# Patient Record
Sex: Female | Born: 1945 | Race: White | Hispanic: No | Smoking: Never smoker
Health system: Southern US, Community
[De-identification: ages and names within clinical notes are randomized; demographics above are authoritative.]

## PROBLEM LIST (undated history)

## (undated) DIAGNOSIS — K219 Gastro-esophageal reflux disease without esophagitis: Secondary | ICD-10-CM

## (undated) DIAGNOSIS — C801 Malignant (primary) neoplasm, unspecified: Secondary | ICD-10-CM

## (undated) DIAGNOSIS — F419 Anxiety disorder, unspecified: Secondary | ICD-10-CM

## (undated) DIAGNOSIS — D649 Anemia, unspecified: Secondary | ICD-10-CM

## (undated) DIAGNOSIS — R112 Nausea with vomiting, unspecified: Secondary | ICD-10-CM

## (undated) DIAGNOSIS — M797 Fibromyalgia: Secondary | ICD-10-CM

## (undated) DIAGNOSIS — I10 Essential (primary) hypertension: Secondary | ICD-10-CM

## (undated) DIAGNOSIS — Z9889 Other specified postprocedural states: Secondary | ICD-10-CM

## (undated) DIAGNOSIS — M1990 Unspecified osteoarthritis, unspecified site: Secondary | ICD-10-CM

## (undated) HISTORY — PX: CERVICAL FUSION: SHX112

## (undated) HISTORY — PX: BACK SURGERY: SHX140

## (undated) HISTORY — PX: OTHER SURGICAL HISTORY: SHX169

## (undated) HISTORY — PX: ABDOMINAL HYSTERECTOMY: SHX81

---

## 2013-09-01 ENCOUNTER — Ambulatory Visit (HOSPITAL_COMMUNITY): Payer: Medicare Other

## 2013-09-01 ENCOUNTER — Other Ambulatory Visit: Payer: Self-pay | Admitting: Neurosurgery

## 2013-09-01 ENCOUNTER — Encounter (HOSPITAL_COMMUNITY): Payer: Self-pay | Admitting: *Deleted

## 2013-09-01 ENCOUNTER — Encounter (HOSPITAL_COMMUNITY)
Admission: AD | Disposition: A | Discharge status: Home / Self Care | Payer: Self-pay | Source: Ambulatory Visit | Attending: Neurosurgery

## 2013-09-01 ENCOUNTER — Observation Stay (HOSPITAL_COMMUNITY)
Admission: AD | Admit: 2013-09-01 | Discharge: 2013-09-02 | Disposition: A | Discharge status: Home / Self Care | Payer: Medicare Other | Source: Ambulatory Visit | Attending: Neurosurgery | Admitting: Neurosurgery

## 2013-09-01 ENCOUNTER — Encounter (HOSPITAL_COMMUNITY): Payer: Self-pay | Admitting: Pharmacy Technician

## 2013-09-01 ENCOUNTER — Ambulatory Visit (HOSPITAL_COMMUNITY): Payer: Medicare Other | Admitting: Anesthesiology

## 2013-09-01 ENCOUNTER — Encounter (HOSPITAL_COMMUNITY): Payer: Medicare Other | Admitting: Anesthesiology

## 2013-09-01 DIAGNOSIS — IMO0002 Reserved for concepts with insufficient information to code with codable children: Secondary | ICD-10-CM | POA: Insufficient documentation

## 2013-09-01 DIAGNOSIS — M51379 Other intervertebral disc degeneration, lumbosacral region without mention of lumbar back pain or lower extremity pain: Secondary | ICD-10-CM | POA: Insufficient documentation

## 2013-09-01 DIAGNOSIS — M5126 Other intervertebral disc displacement, lumbar region: Principal | ICD-10-CM | POA: Insufficient documentation

## 2013-09-01 DIAGNOSIS — I10 Essential (primary) hypertension: Secondary | ICD-10-CM | POA: Insufficient documentation

## 2013-09-01 DIAGNOSIS — M5137 Other intervertebral disc degeneration, lumbosacral region: Secondary | ICD-10-CM | POA: Insufficient documentation

## 2013-09-01 DIAGNOSIS — IMO0001 Reserved for inherently not codable concepts without codable children: Secondary | ICD-10-CM | POA: Insufficient documentation

## 2013-09-01 DIAGNOSIS — F411 Generalized anxiety disorder: Secondary | ICD-10-CM | POA: Insufficient documentation

## 2013-09-01 DIAGNOSIS — K219 Gastro-esophageal reflux disease without esophagitis: Secondary | ICD-10-CM | POA: Insufficient documentation

## 2013-09-01 HISTORY — PX: LUMBAR LAMINECTOMY/DECOMPRESSION MICRODISCECTOMY: SHX5026

## 2013-09-01 HISTORY — DX: Anxiety disorder, unspecified: F41.9

## 2013-09-01 HISTORY — DX: Unspecified osteoarthritis, unspecified site: M19.90

## 2013-09-01 HISTORY — DX: Anemia, unspecified: D64.9

## 2013-09-01 HISTORY — DX: Gastro-esophageal reflux disease without esophagitis: K21.9

## 2013-09-01 HISTORY — DX: Fibromyalgia: M79.7

## 2013-09-01 HISTORY — DX: Malignant (primary) neoplasm, unspecified: C80.1

## 2013-09-01 HISTORY — DX: Essential (primary) hypertension: I10

## 2013-09-01 LAB — CBC
HCT: 33.8 % — ABNORMAL LOW (ref 36.0–46.0)
Hemoglobin: 12.3 g/dL (ref 12.0–15.0)
MCH: 30.4 pg (ref 26.0–34.0)
MCHC: 36.4 g/dL — ABNORMAL HIGH (ref 30.0–36.0)
MCV: 83.7 fL (ref 78.0–100.0)
PLATELETS: 410 10*3/uL — AB (ref 150–400)
RBC: 4.04 MIL/uL (ref 3.87–5.11)
RDW: 12 % (ref 11.5–15.5)
WBC: 12.8 10*3/uL — ABNORMAL HIGH (ref 4.0–10.5)

## 2013-09-01 LAB — BASIC METABOLIC PANEL
BUN: 15 mg/dL (ref 6–23)
CALCIUM: 9.6 mg/dL (ref 8.4–10.5)
CO2: 22 meq/L (ref 19–32)
CREATININE: 0.85 mg/dL (ref 0.50–1.10)
Chloride: 86 mEq/L — ABNORMAL LOW (ref 96–112)
GFR calc non Af Amer: 69 mL/min — ABNORMAL LOW (ref >90)
GFR, EST AFRICAN AMERICAN: 80 mL/min — AB (ref >90)
Glucose, Bld: 106 mg/dL — ABNORMAL HIGH (ref 70–99)
Potassium: 3.8 mEq/L (ref 3.7–5.3)
Sodium: 127 mEq/L — ABNORMAL LOW (ref 137–147)

## 2013-09-01 LAB — SURGICAL PCR SCREEN
MRSA, PCR: NEGATIVE
Staphylococcus aureus: NEGATIVE

## 2013-09-01 SURGERY — LUMBAR LAMINECTOMY/DECOMPRESSION MICRODISCECTOMY 1 LEVEL
Anesthesia: General | Site: Back | Laterality: Left

## 2013-09-01 MED ORDER — ESTRADIOL 1 MG PO TABS
1.0000 mg | ORAL_TABLET | Freq: Every day | ORAL | Status: DC
  Filled 2013-09-01: qty 1

## 2013-09-01 MED ORDER — NEOSTIGMINE METHYLSULFATE 10 MG/10ML IV SOLN
INTRAVENOUS | Status: DC | PRN
  Administered 2013-09-01 (×2): 3 mg via INTRAVENOUS

## 2013-09-01 MED ORDER — METHOCARBAMOL 500 MG PO TABS
500.0000 mg | ORAL_TABLET | Freq: Every evening | ORAL | Status: DC | PRN
  Administered 2013-09-02: 500 mg via ORAL
  Filled 2013-09-01: qty 1

## 2013-09-01 MED ORDER — FENTANYL CITRATE 0.05 MG/ML IJ SOLN
INTRAMUSCULAR | Status: AC
  Filled 2013-09-01: qty 5

## 2013-09-01 MED ORDER — PHENOL 1.4 % MT LIQD: 1.0000 | OROMUCOSAL | Status: DC | PRN

## 2013-09-01 MED ORDER — METHYLPREDNISOLONE ACETATE 80 MG/ML IJ SUSP
INTRAMUSCULAR | Status: DC | PRN
  Administered 2013-09-01: 80 mg

## 2013-09-01 MED ORDER — LACTATED RINGERS IV SOLN
INTRAVENOUS | Status: DC | PRN
  Administered 2013-09-01 (×2): via INTRAVENOUS

## 2013-09-01 MED ORDER — SODIUM CHLORIDE 0.9 % IJ SOLN: 3.0000 mL | Freq: Two times a day (BID) | INTRAMUSCULAR | Status: DC

## 2013-09-01 MED ORDER — PROPOFOL 10 MG/ML IV BOLUS
INTRAVENOUS | Status: DC | PRN
  Administered 2013-09-01: 140 mg via INTRAVENOUS

## 2013-09-01 MED ORDER — LIDOCAINE HCL (CARDIAC) 20 MG/ML IV SOLN
INTRAVENOUS | Status: AC
  Filled 2013-09-01: qty 5

## 2013-09-01 MED ORDER — CEFAZOLIN SODIUM-DEXTROSE 2-3 GM-% IV SOLR
INTRAVENOUS | Status: DC | PRN
  Administered 2013-09-01: 2 g via INTRAVENOUS

## 2013-09-01 MED ORDER — LOSARTAN POTASSIUM-HCTZ 50-12.5 MG PO TABS: 1.0000 | ORAL_TABLET | Freq: Every day | ORAL | Status: DC

## 2013-09-01 MED ORDER — PHENYLEPHRINE HCL 10 MG/ML IJ SOLN
INTRAMUSCULAR | Status: DC | PRN
  Administered 2013-09-01 (×5): 80 ug via INTRAVENOUS

## 2013-09-01 MED ORDER — BISACODYL 10 MG RE SUPP: 10.0000 mg | Freq: Every day | RECTAL | Status: DC | PRN

## 2013-09-01 MED ORDER — ROCURONIUM BROMIDE 100 MG/10ML IV SOLN
INTRAVENOUS | Status: DC | PRN
  Administered 2013-09-01: 50 mg via INTRAVENOUS

## 2013-09-01 MED ORDER — IBUPROFEN 600 MG PO TABS
600.0000 mg | ORAL_TABLET | Freq: Four times a day (QID) | ORAL | Status: DC | PRN
  Filled 2013-09-01: qty 1

## 2013-09-01 MED ORDER — MENTHOL 3 MG MT LOZG
1.0000 | LOZENGE | OROMUCOSAL | Status: DC | PRN
  Filled 2013-09-01: qty 9

## 2013-09-01 MED ORDER — MIDAZOLAM HCL 2 MG/2ML IJ SOLN
INTRAMUSCULAR | Status: AC
  Filled 2013-09-01: qty 2

## 2013-09-01 MED ORDER — HEMOSTATIC AGENTS (NO CHARGE) OPTIME
TOPICAL | Status: DC | PRN
  Administered 2013-09-01: 1 via TOPICAL

## 2013-09-01 MED ORDER — MIDAZOLAM HCL 5 MG/5ML IJ SOLN
INTRAMUSCULAR | Status: DC | PRN
  Administered 2013-09-01 (×2): 2 mg via INTRAVENOUS

## 2013-09-01 MED ORDER — OXYCODONE HCL 5 MG PO TABS
5.0000 mg | ORAL_TABLET | Freq: Once | ORAL | Status: AC | PRN
  Administered 2013-09-01: 5 mg via ORAL

## 2013-09-01 MED ORDER — ONDANSETRON HCL 4 MG/2ML IJ SOLN: 4.0000 mg | Freq: Four times a day (QID) | INTRAMUSCULAR | Status: DC | PRN

## 2013-09-01 MED ORDER — LORATADINE 10 MG PO TABS
10.0000 mg | ORAL_TABLET | Freq: Every day | ORAL | Status: DC
Start: 2013-09-02 — End: 2013-09-02
  Filled 2013-09-01: qty 1

## 2013-09-01 MED ORDER — DIAZEPAM 5 MG PO TABS
5.0000 mg | ORAL_TABLET | Freq: Four times a day (QID) | ORAL | Status: DC | PRN
  Administered 2013-09-01: 5 mg via ORAL

## 2013-09-01 MED ORDER — HYDROCHLOROTHIAZIDE 12.5 MG PO CAPS
12.5000 mg | ORAL_CAPSULE | Freq: Every day | ORAL | Status: DC
  Filled 2013-09-01: qty 1

## 2013-09-01 MED ORDER — GLYCOPYRROLATE 0.2 MG/ML IJ SOLN
INTRAMUSCULAR | Status: AC
  Filled 2013-09-01: qty 2

## 2013-09-01 MED ORDER — VITAMIN D 1000 UNITS PO TABS
2000.0000 [IU] | ORAL_TABLET | Freq: Every day | ORAL | Status: DC
  Filled 2013-09-01: qty 2

## 2013-09-01 MED ORDER — FENTANYL CITRATE 0.05 MG/ML IJ SOLN
INTRAMUSCULAR | Status: DC | PRN
  Administered 2013-09-01: 50 ug via INTRAVENOUS
  Administered 2013-09-01: 100 ug via INTRAVENOUS

## 2013-09-01 MED ORDER — PHENYLEPHRINE 40 MCG/ML (10ML) SYRINGE FOR IV PUSH (FOR BLOOD PRESSURE SUPPORT)
PREFILLED_SYRINGE | INTRAVENOUS | Status: AC
  Filled 2013-09-01: qty 10

## 2013-09-01 MED ORDER — ACETAMINOPHEN 325 MG PO TABS: 650.0000 mg | ORAL_TABLET | ORAL | Status: DC | PRN

## 2013-09-01 MED ORDER — ACETAMINOPHEN 650 MG RE SUPP: 650.0000 mg | RECTAL | Status: DC | PRN

## 2013-09-01 MED ORDER — THROMBIN 5000 UNITS EX SOLR
CUTANEOUS | Status: DC | PRN
  Administered 2013-09-01 (×2): 5000 [IU] via TOPICAL

## 2013-09-01 MED ORDER — DIAZEPAM 5 MG PO TABS
ORAL_TABLET | ORAL | Status: AC
  Filled 2013-09-01: qty 1

## 2013-09-01 MED ORDER — HYDROMORPHONE HCL PF 1 MG/ML IJ SOLN
0.2500 mg | INTRAMUSCULAR | Status: DC | PRN
  Administered 2013-09-01 (×4): 0.5 mg via INTRAVENOUS

## 2013-09-01 MED ORDER — GLYCOPYRROLATE 0.2 MG/ML IJ SOLN
INTRAMUSCULAR | Status: DC | PRN
  Administered 2013-09-01 (×2): 0.4 mg via INTRAVENOUS

## 2013-09-01 MED ORDER — ATORVASTATIN CALCIUM 10 MG PO TABS
10.0000 mg | ORAL_TABLET | Freq: Every day | ORAL | Status: DC
  Filled 2013-09-01: qty 1

## 2013-09-01 MED ORDER — KCL IN DEXTROSE-NACL 20-5-0.45 MEQ/L-%-% IV SOLN
INTRAVENOUS | Status: DC
  Filled 2013-09-01 (×2): qty 1000

## 2013-09-01 MED ORDER — LIDOCAINE-EPINEPHRINE 1 %-1:100000 IJ SOLN
INTRAMUSCULAR | Status: DC | PRN
  Administered 2013-09-01: 10 mL

## 2013-09-01 MED ORDER — SODIUM CHLORIDE 0.9 % IV SOLN: 250.0000 mL | INTRAVENOUS | Status: DC

## 2013-09-01 MED ORDER — LOSARTAN POTASSIUM 50 MG PO TABS
50.0000 mg | ORAL_TABLET | Freq: Every day | ORAL | Status: DC
  Filled 2013-09-01: qty 1

## 2013-09-01 MED ORDER — DOCUSATE SODIUM 100 MG PO CAPS
100.0000 mg | ORAL_CAPSULE | Freq: Two times a day (BID) | ORAL | Status: DC
  Filled 2013-09-01 (×3): qty 1

## 2013-09-01 MED ORDER — ONDANSETRON HCL 4 MG/2ML IJ SOLN
INTRAMUSCULAR | Status: AC
  Filled 2013-09-01: qty 2

## 2013-09-01 MED ORDER — FENTANYL CITRATE 0.05 MG/ML IJ SOLN
INTRAMUSCULAR | Status: DC | PRN
  Administered 2013-09-01: 100 ug via INTRAVENOUS

## 2013-09-01 MED ORDER — SENNA 8.6 MG PO TABS
1.0000 | ORAL_TABLET | Freq: Two times a day (BID) | ORAL | Status: DC
  Filled 2013-09-01 (×2): qty 1

## 2013-09-01 MED ORDER — PROPOFOL 10 MG/ML IV BOLUS
INTRAVENOUS | Status: AC
  Filled 2013-09-01: qty 20

## 2013-09-01 MED ORDER — BUPIVACAINE HCL (PF) 0.5 % IJ SOLN
INTRAMUSCULAR | Status: DC | PRN
  Administered 2013-09-01: 10 mL

## 2013-09-01 MED ORDER — CEFAZOLIN SODIUM-DEXTROSE 2-3 GM-% IV SOLR
INTRAVENOUS | Status: AC
  Filled 2013-09-01: qty 50

## 2013-09-01 MED ORDER — CEFAZOLIN SODIUM 1-5 GM-% IV SOLN
1.0000 g | Freq: Three times a day (TID) | INTRAVENOUS | Status: DC
  Administered 2013-09-02: 1 g via INTRAVENOUS
  Filled 2013-09-01 (×2): qty 50

## 2013-09-01 MED ORDER — OXYCODONE HCL 5 MG/5ML PO SOLN: 5.0000 mg | Freq: Once | ORAL | Status: AC | PRN

## 2013-09-01 MED ORDER — FENTANYL CITRATE 0.05 MG/ML IJ SOLN
INTRAMUSCULAR | Status: AC
  Filled 2013-09-01: qty 2

## 2013-09-01 MED ORDER — POLYETHYLENE GLYCOL 3350 17 G PO PACK
17.0000 g | PACK | Freq: Every day | ORAL | Status: DC | PRN
  Filled 2013-09-01: qty 1

## 2013-09-01 MED ORDER — LACTATED RINGERS IV SOLN: INTRAVENOUS | Status: DC

## 2013-09-01 MED ORDER — ONDANSETRON HCL 4 MG/2ML IJ SOLN: 4.0000 mg | INTRAMUSCULAR | Status: DC | PRN

## 2013-09-01 MED ORDER — LIDOCAINE HCL (CARDIAC) 20 MG/ML IV SOLN
INTRAVENOUS | Status: DC | PRN
  Administered 2013-09-01: 60 mg via INTRAVENOUS

## 2013-09-01 MED ORDER — ONDANSETRON HCL 4 MG/2ML IJ SOLN
INTRAMUSCULAR | Status: DC | PRN
  Administered 2013-09-01: 4 mg via INTRAVENOUS

## 2013-09-01 MED ORDER — HYDROCODONE-ACETAMINOPHEN 5-325 MG PO TABS: 1.0000 | ORAL_TABLET | ORAL | Status: DC | PRN

## 2013-09-01 MED ORDER — POTASSIUM CHLORIDE CRYS ER 20 MEQ PO TBCR
20.0000 meq | EXTENDED_RELEASE_TABLET | Freq: Every day | ORAL | Status: DC
  Filled 2013-09-01: qty 1

## 2013-09-01 MED ORDER — AMLODIPINE BESYLATE 5 MG PO TABS
5.0000 mg | ORAL_TABLET | Freq: Every day | ORAL | Status: DC
  Filled 2013-09-01: qty 1

## 2013-09-01 MED ORDER — MORPHINE SULFATE 2 MG/ML IJ SOLN: 1.0000 mg | INTRAMUSCULAR | Status: DC | PRN

## 2013-09-01 MED ORDER — OXYCODONE HCL 5 MG PO TABS
ORAL_TABLET | ORAL | Status: AC
  Filled 2013-09-01: qty 1

## 2013-09-01 MED ORDER — SODIUM CHLORIDE 0.9 % IJ SOLN: 3.0000 mL | INTRAMUSCULAR | Status: DC | PRN

## 2013-09-01 MED ORDER — ALUM & MAG HYDROXIDE-SIMETH 200-200-20 MG/5ML PO SUSP: 30.0000 mL | Freq: Four times a day (QID) | ORAL | Status: DC | PRN

## 2013-09-01 MED ORDER — 0.9 % SODIUM CHLORIDE (POUR BTL) OPTIME
TOPICAL | Status: DC | PRN
  Administered 2013-09-01: 1000 mL

## 2013-09-01 MED ORDER — FLEET ENEMA 7-19 GM/118ML RE ENEM: 1.0000 | ENEMA | Freq: Once | RECTAL | Status: AC | PRN

## 2013-09-01 MED ORDER — PANTOPRAZOLE SODIUM 40 MG PO TBEC
40.0000 mg | DELAYED_RELEASE_TABLET | Freq: Every day | ORAL | Status: DC
  Administered 2013-09-02: 40 mg via ORAL
  Filled 2013-09-01: qty 1

## 2013-09-01 MED ORDER — HYDROMORPHONE HCL PF 1 MG/ML IJ SOLN
INTRAMUSCULAR | Status: AC
  Filled 2013-09-01: qty 2

## 2013-09-01 MED ORDER — HYDROCODONE-ACETAMINOPHEN 5-325 MG PO TABS: 1.0000 | ORAL_TABLET | Freq: Four times a day (QID) | ORAL | Status: DC | PRN

## 2013-09-01 MED ORDER — MUPIROCIN 2 % EX OINT
TOPICAL_OINTMENT | CUTANEOUS | Status: AC
  Administered 2013-09-01: 1 via NASAL
  Filled 2013-09-01: qty 22

## 2013-09-01 MED ORDER — ALPRAZOLAM 0.5 MG PO TABS: 0.5000 mg | ORAL_TABLET | Freq: Two times a day (BID) | ORAL | Status: DC | PRN

## 2013-09-01 MED ORDER — OXYCODONE-ACETAMINOPHEN 5-325 MG PO TABS
1.0000 | ORAL_TABLET | ORAL | Status: DC | PRN
  Administered 2013-09-02: 2 via ORAL
  Filled 2013-09-01: qty 2

## 2013-09-01 SURGICAL SUPPLY — 61 items
BAG DECANTER FOR FLEXI CONT (MISCELLANEOUS) IMPLANT
BENZOIN TINCTURE PRP APPL 2/3 (GAUZE/BANDAGES/DRESSINGS) IMPLANT
BIT DRILL NEURO 2X3.1 SFT TUCH (MISCELLANEOUS) ×1 IMPLANT
BLADE 10 SAFETY STRL DISP (BLADE) IMPLANT
BLADE SURG ROTATE 9660 (MISCELLANEOUS) IMPLANT
BUR ROUND FLUTED 5 RND (BURR) ×2 IMPLANT
BUR ROUND FLUTED 5MM RND (BURR) ×1
CANISTER SUCT 3000ML (MISCELLANEOUS) ×3 IMPLANT
CLOSURE WOUND 1/2 X4 (GAUZE/BANDAGES/DRESSINGS)
CONT SPEC 4OZ CLIKSEAL STRL BL (MISCELLANEOUS) ×3 IMPLANT
DERMABOND ADVANCED (GAUZE/BANDAGES/DRESSINGS) ×2
DERMABOND ADVANCED .7 DNX12 (GAUZE/BANDAGES/DRESSINGS) ×1 IMPLANT
DRAPE LAPAROTOMY 100X72X124 (DRAPES) ×3 IMPLANT
DRAPE MICROSCOPE LEICA (MISCELLANEOUS) ×3 IMPLANT
DRAPE POUCH INSTRU U-SHP 10X18 (DRAPES) ×3 IMPLANT
DRAPE SURG 17X23 STRL (DRAPES) ×3 IMPLANT
DRESSING TELFA 8X3 (GAUZE/BANDAGES/DRESSINGS) IMPLANT
DRILL NEURO 2X3.1 SOFT TOUCH (MISCELLANEOUS) ×3
DRSG OPSITE POSTOP 3X4 (GAUZE/BANDAGES/DRESSINGS) ×3 IMPLANT
DURAPREP 26ML APPLICATOR (WOUND CARE) ×3 IMPLANT
ELECT REM PT RETURN 9FT ADLT (ELECTROSURGICAL) ×3
ELECTRODE REM PT RTRN 9FT ADLT (ELECTROSURGICAL) ×1 IMPLANT
GAUZE SPONGE 4X4 16PLY XRAY LF (GAUZE/BANDAGES/DRESSINGS) IMPLANT
GLOVE BIO SURGEON STRL SZ8 (GLOVE) ×3 IMPLANT
GLOVE BIOGEL PI IND STRL 7.0 (GLOVE) ×1 IMPLANT
GLOVE BIOGEL PI IND STRL 8 (GLOVE) ×1 IMPLANT
GLOVE BIOGEL PI IND STRL 8.5 (GLOVE) IMPLANT
GLOVE BIOGEL PI INDICATOR 7.0 (GLOVE) ×2
GLOVE BIOGEL PI INDICATOR 8 (GLOVE) ×2
GLOVE BIOGEL PI INDICATOR 8.5 (GLOVE)
GLOVE ECLIPSE 8.0 STRL XLNG CF (GLOVE) IMPLANT
GLOVE EXAM NITRILE LRG STRL (GLOVE) IMPLANT
GLOVE EXAM NITRILE MD LF STRL (GLOVE) ×3 IMPLANT
GLOVE EXAM NITRILE XL STR (GLOVE) IMPLANT
GLOVE EXAM NITRILE XS STR PU (GLOVE) IMPLANT
GLOVE SURG SS PI 6.5 STRL IVOR (GLOVE) ×3 IMPLANT
GOWN BRE IMP SLV AUR LG STRL (GOWN DISPOSABLE) IMPLANT
GOWN BRE IMP SLV AUR XL STRL (GOWN DISPOSABLE) IMPLANT
GOWN STRL REIN 2XL LVL4 (GOWN DISPOSABLE) IMPLANT
GOWN STRL REUS W/ TWL LRG LVL3 (GOWN DISPOSABLE) ×2 IMPLANT
GOWN STRL REUS W/TWL LRG LVL3 (GOWN DISPOSABLE) ×4
KIT BASIN OR (CUSTOM PROCEDURE TRAY) ×3 IMPLANT
KIT ROOM TURNOVER OR (KITS) ×3 IMPLANT
NEEDLE HYPO 18GX1.5 BLUNT FILL (NEEDLE) ×3 IMPLANT
NEEDLE HYPO 25X1 1.5 SAFETY (NEEDLE) ×3 IMPLANT
NS IRRIG 1000ML POUR BTL (IV SOLUTION) ×3 IMPLANT
PACK LAMINECTOMY NEURO (CUSTOM PROCEDURE TRAY) ×3 IMPLANT
PAD ARMBOARD 7.5X6 YLW CONV (MISCELLANEOUS) ×9 IMPLANT
RUBBERBAND STERILE (MISCELLANEOUS) ×6 IMPLANT
SPONGE GAUZE 4X4 12PLY (GAUZE/BANDAGES/DRESSINGS) IMPLANT
SPONGE SURGIFOAM ABS GEL SZ50 (HEMOSTASIS) ×3 IMPLANT
STRIP CLOSURE SKIN 1/2X4 (GAUZE/BANDAGES/DRESSINGS) IMPLANT
SUT VIC AB 0 CT1 18XCR BRD8 (SUTURE) ×1 IMPLANT
SUT VIC AB 0 CT1 8-18 (SUTURE) ×2
SUT VIC AB 2-0 CT1 18 (SUTURE) ×3 IMPLANT
SUT VIC AB 3-0 SH 8-18 (SUTURE) ×3 IMPLANT
SYR 20ML ECCENTRIC (SYRINGE) ×3 IMPLANT
SYR 5ML LL (SYRINGE) ×3 IMPLANT
TOWEL OR 17X24 6PK STRL BLUE (TOWEL DISPOSABLE) ×3 IMPLANT
TOWEL OR 17X26 10 PK STRL BLUE (TOWEL DISPOSABLE) ×3 IMPLANT
WATER STERILE IRR 1000ML POUR (IV SOLUTION) ×3 IMPLANT

## 2013-09-01 NOTE — Plan of Care (Signed)
Problem: Consults Goal: Diagnosis - Spinal Surgery Outcome: Completed/Met Date Met:  09/01/13 Microdiscectomy

## 2013-09-01 NOTE — Transfer of Care (Signed)
Immediate Anesthesia Transfer of Care Note  Patient: Katherine Reed  Procedure(s) Performed: Procedure(s) with comments: LUMBAR LAMINECTOMY/DECOMPRESSION MICRODISCECTOMY 1 LEVEL (Left) - Left Lumbar Four-Five Redo Microdiskectomy  Patient Location: PACU  Anesthesia Type:General  Level of Consciousness: awake and alert   Airway & Oxygen Therapy: Patient Spontanous Breathing and Patient connected to nasal cannula oxygen  Post-op Assessment: Report given to PACU RN and Post -op Vital signs reviewed and stable  Post vital signs: Reviewed and stable  Complications: No apparent anesthesia complications

## 2013-09-01 NOTE — H&P (Signed)
[place], [place] [address] Phone: [phone]   Patient ID:   [id number] Patient: Katherine Reed  Date of Birth: 08-23-1945 Visit Type: Office Visit   Date: 09/01/2013 01:00 PM Provider: Marchia Meiers. Vertell Limber MD   This 68 year old female presents for Follow Up of back pain.  History of Present Illness: 1.  Follow Up of back pain  07/19/13  Left L4-5 microdiscectomy  Katherine Reed returns after MRI to investigate report of increasing left buttock & thigh pain not calmed by Medrol dose pak, Norco, or Robaxin.  Her incision is well-healed.  Norco 5/325 QID no help Robaxin 500mg  TID no help Medrol Dose Pack ended no help Ibuprofen 800mg  yesterday helped only little  MRI on [facility]  Patient returns today and is quite miserable with left leg pain and complains of left buttock and thigh pain greater than her preoperative pain.  On examination she has difficulty bearing weight on her left leg and his left dorsiflexion and extensor hallucis longus weakness.  She has a markedly positive straight leg raise on the left.  The patient was feeling great after surgery for the first 4 weeks and then had sudden recurrence of left leg pain which has been unrelenting and is causing her misery.  She is crying in the office today.  Per my review of the MRI there appears to be a recurrent disc herniation.  The radiologist felt that it was a satisfactory appearing study but on the sagittal images there is clear evidence of a recurrent disc herniation.  I do not believe there are other circumstances which are affecting her such as spinal fluid leakage or infection and believe this is a recurrent disc herniation causing significant radicular pain.  The patient wants to get relief incision connecting of the live like this.      Medical/Surgical/Interim History Reviewed, no change.    PAST MEDICAL HISTORY, SURGICAL HISTORY, FAMILY HISTORY, SOCIAL HISTORY AND REVIEW OF SYSTEMS I  have reviewed the patient's past medical, surgical, family and social history as well as the comprehensive review of systems as included on the [facility] history form dated 06/27/2013, which I have signed.  Family History: Reviewed, no changes.    Social History: Tobacco use reviewed. Reviewed, no changes.     MEDICATIONS(added, continued or stopped this visit):   Started Medication Directions Instruction Stopped   alprazolam 0.5 mg tablet take 1 tablet by oral route 2 times every day     amlodipine 5 mg tablet take 1 tablet by oral route  every day     Crestor 10 mg tablet take 1 tablet by oral route 2 times a week     estradiol 1 mg tablet take 1 tablet by oral route  every day     fexofenadine 180 mg tablet take 1 tablet by oral route  every day    08/15/2013 hydrocodone 5 mg-acetaminophen 325 mg tablet take 1 tablet by oral route  every 6 hours as needed for pain     klor-con M20 23mEq ORAL TABLET 1 tablet daily     losartan 50 mg-hydrochlorothiazide 12.5 mg tablet take 1 tablet by oral route  every day    08/23/2013 Medrol (Pak) 4 mg tablets in a dose pack Take as directed    08/15/2013 methocarbamol 500 mg tablet take 1 tablet by oral route 3 times every day     omeprazole 40 mg capsule,delayed release take 1 capsule by oral  route  every day before a meal     Vitamin D3 2,000 unit capsule 1 capsule daily      ALLERGIES:  Ingredient Reaction Medication Name Comment  NO KNOWN ALLERGIES     No known allergies.   Vitals Date Temp F BP Pulse Ht In Wt Lb BMI BSA Pain Score  09/01/2013  109/71 99 65 150 24.96  8/10        IMPRESSION Patient has L4 L5 disc herniation recurrence with severe back and left leg pain.  The patient will be admitted to the hospital same day basis and undergo urgent redo microdiscectomy L4 L5 left.  Assessment/Plan # Detail Type Description   1. 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       2.  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       3. Assessment Lumbar spondylosis (721.3).       4. Assessment Lumbar radiculopathy (724.4).         Pain Assessment/Treatment Pain Scale: 8/10. Method: Numeric Pain Intensity Scale. Location: back. Onset: 08/16/2007. Duration: varies. Quality: sharp. Pain Assessment/Treatment follow-up plan of care: Patient currently taking pain medication as prescribed..  Fall Risk Plan The patient has not fallen in the last year.  Risks and benefits discussed in detail with the patient wished to proceed with surgery.             Provider:  Marchia Meiers. Vertell Limber MD  09/01/2013 01:17 PM Dictation edited by: Marchia Meiers. Vertell Limber    CC Providers: Isabelle Course [address] [place]  [address]-   Stuart Kramer [address] [place] [address]-  ----------------------------------------------------------------------------------------------------------------------------------------------------------------------         Electronically signed by Marchia Meiers. Vertell Limber MD on 09/01/2013 01:17 PM

## 2013-09-01 NOTE — Anesthesia Preprocedure Evaluation (Signed)
Anesthesia Evaluation  Patient identified by MRN, date of birth, ID band Patient awake    Reviewed: Allergy & Precautions, H&P , NPO status , Patient's Chart, lab work & pertinent test results  Airway Mallampati: II  Neck ROM: full    Dental   Pulmonary          Cardiovascular hypertension,     Neuro/Psych Anxiety  Neuromuscular disease    GI/Hepatic GERD-  ,  Endo/Other    Renal/GU      Musculoskeletal  (+) Arthritis -, Fibromyalgia -  Abdominal   Peds  Hematology   Anesthesia Other Findings   Reproductive/Obstetrics                           Anesthesia Physical Anesthesia Plan  ASA: II  Anesthesia Plan: General   Post-op Pain Management:    Induction: Intravenous  Airway Management Planned: Oral ETT  Additional Equipment:   Intra-op Plan:   Post-operative Plan: Extubation in OR  Informed Consent: I have reviewed the patients History and Physical, chart, labs and discussed the procedure including the risks, benefits and alternatives for the proposed anesthesia with the patient or authorized representative who has indicated his/her understanding and acceptance.     Plan Discussed with: CRNA, Anesthesiologist and Surgeon  Anesthesia Plan Comments:         Anesthesia Quick Evaluation

## 2013-09-01 NOTE — Op Note (Signed)
09/01/2013  8:43 PM  PATIENT:  Katherine Reed  68 y.o. female  PRE-OPERATIVE DIAGNOSIS:  Lumbago, herniated lumbar disc, radiculopathy, degenerative disc disease L 45 Left  POST-OPERATIVE DIAGNOSIS:  Lumbago, herniated lumbar disc, radiculopathy, degenerative disc disease L 45 Left  PROCEDURE:  Procedure(s) with comments: LUMBAR LAMINECTOMY/DECOMPRESSION MICRODISCECTOMY 1 LEVEL (Left) - Left Lumbar Four-Five Redo Microdiskectomy with microdisection  SURGEON:  Surgeon(s) and Role:    * Erline Levine, MD - Primary  PHYSICIAN ASSISTANT:   ASSISTANTS: none   ANESTHESIA:   general  EBL:     BLOOD ADMINISTERED:none  DRAINS: none   LOCAL MEDICATIONS USED:  MARCAINE     SPECIMEN:  No Specimen  DISPOSITION OF SPECIMEN:  N/A  COUNTS:  YES  TOURNIQUET:  * No tourniquets in log *  DICTATION: DICTATION: Patient has a recurrent L4-5 disc rupture on the left with significant left leg pain and weakness. It was elected to take her to surgery for redo left L4-5 microdiscectomy.  She had prior left L4/5 discectomy one month ago.  Procedure: Patient was brought to the operating room and following the smooth and uncomplicated induction of general endotracheal anesthesia she was placed in a prone position on the Wilson frame. Low back was prepped and draped in the usual sterile fashion with betadine scrub and  DuraPrep. Area of planned incision was infiltrated with local lidocaine. Incision was made in the midline through previous scar tissue and carried to the lumbodorsal fascia which was incised on the left side of midline. Subperiosteal dissection was performed exposing what was felt to be L45 level. Intraoperative x-ray confirmed correct level.   Exposure was carried to expose the L4-5 level and site of prior laminectomy. The previous bony defect was defined and bone removal was extended in laterally with Kerrison rongeurs. Scar tissue was detached and removed in a piecemeal fashion and under the  microscope. The L5 nerve root was mobilized medially exposing multiple fragments of caudally migrated disc material which were causing L5 nerve root compression. The annular defect extended into the interspace and I therefore  elected to incise the interspace and remove additional disc material including spondylotic material along the superior aspect of L 5 . The medial and lateral aspects of the interspace were further evacuated of residual disc material and the wound was then irrigated with  saline and no additional disc material was mobilized. Hemostasis was assured with bipolar electrocautery and the interspace was irrigated with Depo-Medrol and fentanyl. The lumbodorsal fascia was closed with 0 Vicryl sutures the subcutaneous tissues reapproximated 2-0 Vicryl inverted sutures and the skin edges were reapproximated with 3-0 Vicryl subcuticular stitch. The wound is dressed with Dermabond and an occlusive dressing. Patient was extubated in the operating room and taken to recovery in stable and satisfactory condition having tolerated her operation well. Counts were correct at the end of the case.  PLAN OF CARE: Admit for overnight observation  PATIENT DISPOSITION:  PACU - hemodynamically stable.   Delay start of Pharmacological VTE agent (>24hrs) due to surgical blood loss or risk of bleeding: yes

## 2013-09-01 NOTE — Progress Notes (Signed)
Spoke with [name] in Dr.Stern's office,informed that orders need to be signed

## 2013-09-01 NOTE — Brief Op Note (Signed)
09/01/2013  8:43 PM  PATIENT:  Katherine Reed  67 y.o. female  PRE-OPERATIVE DIAGNOSIS:  Lumbago, herniated lumbar disc, radiculopathy, degenerative disc disease L 45 Left  POST-OPERATIVE DIAGNOSIS:  Lumbago, herniated lumbar disc, radiculopathy, degenerative disc disease L 45 Left  PROCEDURE:  Procedure(s) with comments: LUMBAR LAMINECTOMY/DECOMPRESSION MICRODISCECTOMY 1 LEVEL (Left) - Left Lumbar Four-Five Redo Microdiskectomy with microdisection  SURGEON:  Surgeon(s) and Role:    * Jasiya Markie, MD - Primary  PHYSICIAN ASSISTANT:   ASSISTANTS: none   ANESTHESIA:   general  EBL:     BLOOD ADMINISTERED:none  DRAINS: none   LOCAL MEDICATIONS USED:  MARCAINE     SPECIMEN:  No Specimen  DISPOSITION OF SPECIMEN:  N/A  COUNTS:  YES  TOURNIQUET:  * No tourniquets in log *  DICTATION: DICTATION: Patient has a recurrent L4-5 disc rupture on the left with significant left leg pain and weakness. It was elected to take her to surgery for redo left L4-5 microdiscectomy.  She had prior left L4/5 discectomy one month ago.  Procedure: Patient was brought to the operating room and following the smooth and uncomplicated induction of general endotracheal anesthesia she was placed in a prone position on the Wilson frame. Low back was prepped and draped in the usual sterile fashion with betadine scrub and  DuraPrep. Area of planned incision was infiltrated with local lidocaine. Incision was made in the midline through previous scar tissue and carried to the lumbodorsal fascia which was incised on the left side of midline. Subperiosteal dissection was performed exposing what was felt to be L45 level. Intraoperative x-ray confirmed correct level.   Exposure was carried to expose the L4-5 level and site of prior laminectomy. The previous bony defect was defined and bone removal was extended in laterally with Kerrison rongeurs. Scar tissue was detached and removed in a piecemeal fashion and under the  microscope. The L5 nerve root was mobilized medially exposing multiple fragments of caudally migrated disc material which were causing L5 nerve root compression. The annular defect extended into the interspace and I therefore  elected to incise the interspace and remove additional disc material including spondylotic material along the superior aspect of L 5 . The medial and lateral aspects of the interspace were further evacuated of residual disc material and the wound was then irrigated with  saline and no additional disc material was mobilized. Hemostasis was assured with bipolar electrocautery and the interspace was irrigated with Depo-Medrol and fentanyl. The lumbodorsal fascia was closed with 0 Vicryl sutures the subcutaneous tissues reapproximated 2-0 Vicryl inverted sutures and the skin edges were reapproximated with 3-0 Vicryl subcuticular stitch. The wound is dressed with Dermabond and an occlusive dressing. Patient was extubated in the operating room and taken to recovery in stable and satisfactory condition having tolerated her operation well. Counts were correct at the end of the case.  PLAN OF CARE: Admit for overnight observation  PATIENT DISPOSITION:  PACU - hemodynamically stable.   Delay start of Pharmacological VTE agent (>24hrs) due to surgical blood loss or risk of bleeding: yes  

## 2013-09-01 NOTE — Interval H&P Note (Signed)
History and Physical Interval Note:  09/01/2013 5:40 PM  Katherine Reed  has presented today for surgery, with the diagnosis of Lumbago  The various methods of treatment have been discussed with the patient and family. After consideration of risks, benefits and other options for treatment, the patient has consented to  Procedure(s) with comments: LUMBAR LAMINECTOMY/DECOMPRESSION MICRODISCECTOMY 1 LEVEL (Left) - Left L4-5 Redo Microdiskectomy as a surgical intervention .  The patient's history has been reviewed, patient examined, no change in status, stable for surgery.  I have reviewed the patient's chart and labs.  Questions were answered to the patient's satisfaction.     Erline Levine

## 2013-09-01 NOTE — Anesthesia Postprocedure Evaluation (Signed)
Anesthesia Post Note  Patient: Museum/gallery conservator  Procedure(s) Performed: Procedure(s) (LRB): LUMBAR LAMINECTOMY/DECOMPRESSION MICRODISCECTOMY 1 LEVEL (Left)  Anesthesia type: General  Patient location: PACU  Post pain: Pain level controlled and Adequate analgesia  Post assessment: Post-op Vital signs reviewed, Patient's Cardiovascular Status Stable, Respiratory Function Stable, Patent Airway and Pain level controlled  Last Vitals:  Filed Vitals:   09/01/13 2115  BP: 131/54  Pulse: 86  Temp:   Resp: 24    Post vital signs: Reviewed and stable  Level of consciousness: awake, alert  and oriented  Complications: No apparent anesthesia complications

## 2013-09-01 NOTE — Progress Notes (Signed)
Awake, alert, conversant.  Full strength left leg, no numbness or pain.  Doing well.

## 2013-09-02 ENCOUNTER — Encounter (HOSPITAL_COMMUNITY): Payer: Self-pay | Admitting: Neurosurgery

## 2013-09-02 MED ORDER — PROMETHAZINE HCL 25 MG PO TABS
25.0000 mg | ORAL_TABLET | ORAL | Status: DC | PRN
  Administered 2013-09-02: 25 mg via ORAL
  Filled 2013-09-02: qty 1

## 2013-09-02 NOTE — Discharge Instructions (Signed)

## 2013-09-02 NOTE — Progress Notes (Signed)
UR Completed.  Rony Ratz Jane Taneshia Lorence [phone] 09/02/2013  

## 2013-09-02 NOTE — Evaluation (Signed)
Physical Therapy Evaluation/ Discharge Patient Details Name: Katherine Reed MRN: [id number] DOB: 1945-05-23 Today's Date: 09/02/2013   History of Present Illness  Pt with back and left thigh pain s/p redo L4-5 microdiscectomy/lami  Clinical Impression  Pt very pleasant with burning left thigh pain but equal sensation and strength bil LE. Pt familiar with precautions from prior surgery and educated with handout as well as provided tips to maintain precautions particularly with bathing and transfers. Pt with family able to assist as needed and performing all mobility without physical assist. All education provided with pt able to state or demonstrate. No further needs at this time with pt and family aware and agreeable.     Follow Up Recommendations No PT follow up    Equipment Recommendations  None recommended by PT    Recommendations for Other Services       Precautions / Restrictions Precautions Precautions: Back Precaution Booklet Issued: Yes (comment)      Mobility  Bed Mobility Overal bed mobility: Modified Independent                Transfers Overall transfer level: Modified independent                  Ambulation/Gait Ambulation/Gait assistance: Modified independent (Device/Increase time) Ambulation Distance (Feet): 250 Feet Assistive device: None Gait Pattern/deviations: Step-through pattern;Decreased stride length   Gait velocity interpretation: Below normal speed for age/gender    Stairs            Wheelchair Mobility    Modified Rankin (Stroke Patients Only)       Balance                                             Pertinent Vitals/Pain 2/10 incisional pain and left thigh pain    Home Living Family/patient expects to be discharged to:: Private residence Living Arrangements: Alone Available Help at Discharge: Family;Available PRN/intermittently Type of Home: House Home Access: Stairs to enter   [employer] of Steps: 1 Home Layout: One level Home Equipment: None      Prior Function Level of Independence: Independent               Hand Dominance        Extremity/Trunk Assessment   Upper Extremity Assessment: Overall WFL for tasks assessed           Lower Extremity Assessment: Overall WFL for tasks assessed      Cervical / Trunk Assessment: Normal  Communication   Communication: No difficulties  Cognition Arousal/Alertness: Awake/alert Behavior During Therapy: WFL for tasks assessed/performed Overall Cognitive Status: Within Functional Limits for tasks assessed                      General Comments      Exercises        Assessment/Plan    PT Assessment Patent does not need any further PT services  PT Diagnosis     PT Problem List    PT Treatment Interventions     PT Goals (Current goals can be found in the Care Plan section) Acute Rehab PT Goals PT Goal Formulation: No goals set, d/c therapy    Frequency     Barriers to discharge        Co-evaluation  End of Session   Activity Tolerance: Patient tolerated treatment well Patient left: in bed;with call bell/phone within reach;with family/visitor present Nurse Communication: Mobility status;Precautions    Functional Assessment Tool Used: clinical judgement Functional Limitation: Mobility: Walking and moving around Mobility: Walking and Moving Around Current Status (Y7741): At least 1 percent but less than 20 percent impaired, limited or restricted Mobility: Walking and Moving Around Goal Status [phone]): At least 1 percent but less than 20 percent impaired, limited or restricted Mobility: Walking and Moving Around Discharge Status [phone]): At least 1 percent but less than 20 percent impaired, limited or restricted    Time: 0822-0836 PT Time Calculation (min): 14 min   Charges:   PT Evaluation $Initial PT Evaluation Tier I: 1 Procedure     PT G  Codes:   Functional Assessment Tool Used: clinical judgement Functional Limitation: Mobility: Walking and moving around    [employer] 09/02/2013, 8:56 AM Elwyn Reach, [place]

## 2013-09-02 NOTE — Progress Notes (Signed)
Pt. Alert and oriented,follows simple instructions, denies pain. Incision area without swelling, redness or S/S of infection. Voiding adequate clear yellow urine. Moving all extremities well and vitals stable and documented. Patient discharged home.Lumbar surgery notes instructions given to patient and family member for home safety and precautions. Pt. and family stated understanding of instructions given

## 2013-09-02 NOTE — Progress Notes (Signed)
Patient doing well.  Discharge home. 

## 2013-09-02 NOTE — Progress Notes (Signed)
OT Cancellation Note  Patient Details Name: Katherine Reed MRN: [id number] DOB: 03-05-1946   Cancelled Treatment:    Reason Eval/Treat Not Completed: OT screened, no needs identified, will sign off.   09/02/2013 Luther Bradley OTR/L Pager [phone] Office [phone]

## 2013-09-02 NOTE — Discharge Summary (Signed)
Physician Discharge Summary  Patient ID: Katherine Reed MRN: [id number] DOB/AGE: [birth year] 68 y.o.  Admit date: 09/01/2013 Discharge date: 09/02/2013  Admission Diagnoses: Lumbago, herniated lumbar disc, radiculopathy, degenerative disc disease L 45 Left   Discharge Diagnoses: Lumbago, herniated lumbar disc, radiculopathy, degenerative disc disease L 45 Left s/p LUMBAR LAMINECTOMY/DECOMPRESSION MICRODISCECTOMY 1 LEVEL (Left) - Left Lumbar Four-Five Redo Microdiskectomy with microdisection  Active Problems:   HNP (herniated nucleus pulposus), lumbar   Discharged Condition: good  Hospital Course: Katherine Reed was admitted for redo microdiscectomy.  Following uncomplicated surgery, she recovered nicely & transferred to 3500 for observation. She has progressed well.  Consults: None  Significant Diagnostic Studies: radiology: X-Ray: intra-operative  Treatments: surgery: LUMBAR LAMINECTOMY/DECOMPRESSION MICRODISCECTOMY 1 LEVEL (Left) - Left Lumbar Four-Five Redo Microdiskectomy with microdisection   Discharge Exam: Blood pressure 124/72, pulse 86, temperature 97.8 F (36.6 C), temperature source Oral, resp. rate 18, height 5\' 5"  (1.651 m), weight 68.295 kg (150 lb 9 oz), SpO2 98.00%. Alert, conversant. Family present. Honeycomb drsg intact over Dermabond. No erythema, swelling, or drainage. Good strength BLE.    Disposition: Discharge to Home. Pt verbalizes understanding of d/c instructions & will keep her scheduled appt in June. She has medications at home.      Medication List    ASK your doctor about these medications       ALPRAZolam 0.5 MG tablet  Commonly known as:  XANAX  Take 0.5 mg by mouth 2 (two) times daily as needed for anxiety or sleep.     amLODipine 5 MG tablet  Commonly known as:  NORVASC  Take 5 mg by mouth daily.     estradiol 1 MG tablet  Commonly known as:  ESTRACE  Take 1 mg by mouth daily.     fexofenadine 180 MG tablet  Commonly known as:   ALLEGRA  Take 180 mg by mouth daily as needed for allergies or rhinitis.     HYDROcodone-acetaminophen 5-325 MG per tablet  Commonly known as:  NORCO/VICODIN  Take 1 tablet by mouth every 6 (six) hours as needed (pain).     ibuprofen 200 MG tablet  Commonly known as:  ADVIL,MOTRIN  Take 600-800 mg by mouth every 6 (six) hours as needed (pain).     losartan-hydrochlorothiazide 50-12.5 MG per tablet  Commonly known as:  HYZAAR  Take 1 tablet by mouth daily.     methocarbamol 500 MG tablet  Commonly known as:  ROBAXIN  Take 500 mg by mouth at bedtime as needed for muscle spasms.     omeprazole 40 MG capsule  Commonly known as:  PRILOSEC  Take 40 mg by mouth daily.     potassium chloride SA 20 MEQ tablet  Commonly known as:  K-DUR,KLOR-CON  Take 20 mEq by mouth daily.     rosuvastatin 10 MG tablet  Commonly known as:  CRESTOR  Take 10 mg by mouth 2 (two) times a week. Sunday and wednesday     Vitamin D3 2000 UNITS Tabs  Take 2,000 Units by mouth daily.         SignedVerdis Prime 09/02/2013, 8:17 AM

## 2013-09-02 NOTE — Progress Notes (Signed)
Subjective: Patient reports "My leg isn't hurting like it was...a little burning sensation in my thigh at times, but so much better"  Objective: Vital signs in last 24 hours: Temp:  [97.5 F (36.4 C)-98.2 F (36.8 C)] 97.8 F (36.6 C) (05/01 0418) Pulse Rate:  [77-115] 86 (05/01 0418) Resp:  [10-32] 18 (05/01 0418) BP: (111-159)/(49-78) 124/72 mmHg (05/01 0418) SpO2:  [93 %-100 %] 98 % (05/01 0418) Weight:  [68.295 kg (150 lb 9 oz)] 68.295 kg (150 lb 9 oz) (04/30 1536)  Intake/Output from previous day: 04/30 0701 - 05/01 0700 In: 1520 [P.O.:120; I.V.:1400] Out: 50 [Blood:50] Intake/Output this shift:    Alert, conversant. Family present. Honeycomb drsg intact over Dermabond. No erythema, swelling, or drainage. Good strength BLE.   Lab Results:  Recent Labs  09/01/13 1550  WBC 12.8*  HGB 12.3  HCT 33.8*  PLT 410*   BMET  Recent Labs  09/01/13 1550  NA 127*  K 3.8  CL 86*  CO2 22  GLUCOSE 106*  BUN 15  CREATININE 0.85  CALCIUM 9.6    Studies/Results: Dg Chest 2 View  09/01/2013   CLINICAL DATA:  Laminectomy.  Preop chest.  EXAM: CHEST  2 VIEW  COMPARISON:  None.  FINDINGS: The heart size and mediastinal contours are within normal limits. Both lungs are clear. The visualized skeletal structures are unremarkable.  IMPRESSION: No active cardiopulmonary disease.   Electronically Signed   By: Marcello Moores  Register   On: 09/01/2013 15:55   Dg Lumbar Spine 1 View  09/02/2013   CLINICAL DATA:  Lumbar laminectomy decompression microdiscectomy  EXAM: LUMBAR SPINE - 1 VIEW  COMPARISON:  Portable cross-table lateral view at 1958 hr compared to MRI lumbar spine of 09/01/2013  Exam interpreted on 09/02/2013.  FINDINGS: Five lumbar vertebrae labeled on preceding MR.  Metallic probe from posterior approach projects dorsal to the L4-L5 disc space, which appears narrowed.  Bones appear demineralized.  Tissue spreaders projects dorsal to L5 vertebra.  Vertebral body heights maintained.   IMPRESSION: Posterior localization of the L4-L5 disc space level.   Electronically Signed   By: Lavonia Dana M.D.   On: 09/02/2013 07:52    Assessment/Plan: Improved   LOS: 1 day  Ok to d/c to home per DrStern. Pt verbalizes understanding of d/c instructions & will keep her scheduled appt in June. She has medications at home.   Aaron Edelman Jonel Weldon 09/02/2013, 8:14 AM

## 2015-04-21 IMAGING — DX DG LUMBAR SPINE 1V
1 series · 1 of 1 positions shown · non-contrast
Comparison: Portable cross-table lateral view at 0843 hr compared
to MRI lumbar spine of 09/01/2013

Exam interpreted on 09/02/2013.

CLINICAL DATA: Lumbar laminectomy decompression microdiscectomy

EXAM:
LUMBAR SPINE - 1 VIEW

[lat]
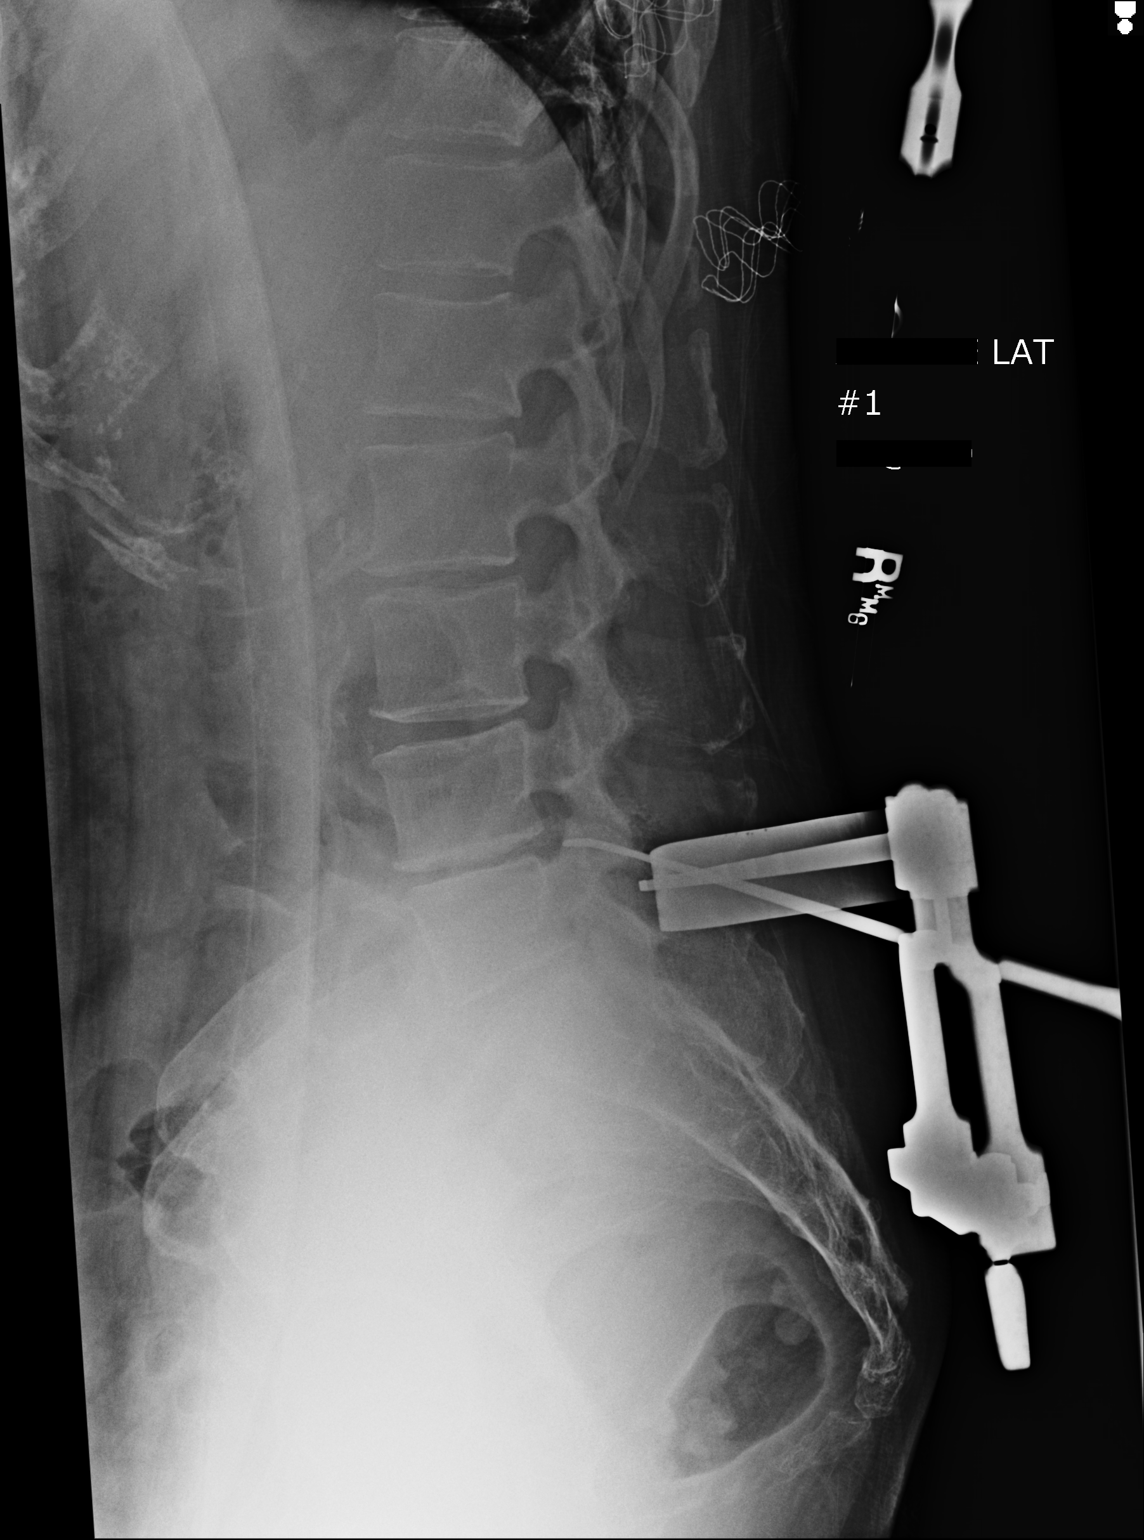

[1 of 1 positions shown; findings below may reference images not displayed]

FINDINGS: Five lumbar vertebrae labeled on preceding MR.

Metallic probe from posterior approach projects dorsal to the L4-L5
disc space, which appears narrowed.

Bones appear demineralized.

Tissue spreaders projects dorsal to L5 vertebra.

Vertebral body heights maintained.
IMPRESSION: Posterior localization of the L4-L5 disc space level.

## 2021-03-14 LAB — COLOGUARD: COLOGUARD: NEGATIVE

## 2024-02-24 NOTE — Patient Instructions (Addendum)
 SURGICAL WAITING ROOM VISITATION Patients having surgery or a procedure may have no more than 2 support people in the waiting area - these visitors may rotate.    Children under the age of 66 must have an adult with them who is not the patient.  If the patient needs to stay at the hospital during part of their recovery, the visitor guidelines for inpatient rooms apply. Pre-op nurse will coordinate an appropriate time for 1 support person to accompany patient in pre-op.  This support person may not rotate.    Please refer to the [facility] website for the visitor guidelines for Inpatients (after your surgery is over and you are in a regular room).       Your procedure is scheduled on: 03-09-24   Report to [facility] Main Entrance    Report to admitting at 6:15 AM   Call this number if you have problems the morning of surgery [phone]   Do not eat food :After Midnight.   After Midnight you may have the following liquids until 5:30 AM DAY OF SURGERY  Water Non-Citrus Juices (without pulp, NO RED-Apple, White grape, White cranberry) Black Coffee (NO MILK/CREAM OR CREAMERS, sugar ok)  Clear Tea (NO MILK/CREAM OR CREAMERS, sugar ok) regular and decaf                             Plain Jell-O (NO RED)                                           Fruit ices (not with fruit pulp, NO RED)                                     Popsicles (NO RED)                                                               Sports drinks like Gatorade (NO RED)                   The day of surgery:  Drink ONE (1) Pre-Surgery Clear Ensure by 5:30 AM the morning of surgery. Drink in one sitting. Do not sip.  This drink was given to you during your hospital  pre-op appointment visit. Nothing else to drink after completing the Pre-Surgery Clear Ensure.          If you have questions, please contact your surgeon's office.   FOLLOW ANY ADDITIONAL PRE OP INSTRUCTIONS YOU RECEIVED FROM YOUR SURGEON'S  OFFICE!!!     Oral Hygiene is also important to reduce your risk of infection.                                    Remember - BRUSH YOUR TEETH THE MORNING OF SURGERY WITH YOUR REGULAR TOOTHPASTE   Do NOT smoke after Midnight   Take these medicines the morning of surgery with A SIP OF WATER:    Amlodipine    Omeprazole  Pregabalin   Rosuvastatin   Zyrtec   Alprazolam , Tylenol , Meclizine if needed  Stop all vitamins and herbal supplements 7 days before surgery  Bring CPAP mask and tubing day of surgery.                              You may not have any metal on your body including hair pins, jewelry, and body piercing             Do not wear make-up, lotions, powders, perfumes or deodorant  Do not wear nail polish including gel and S&S, artificial/acrylic nails, or any other type of covering on natural nails including finger and toenails. If you have artificial nails, gel coating, etc. that needs to be removed by a nail salon please have this removed prior to surgery or surgery may need to be canceled/ delayed if the surgeon/ anesthesia feels like they are unable to be safely monitored.   Do not shave  48 hours prior to surgery.    Do not bring valuables to the hospital. [facility] IS NOT RESPONSIBLE   FOR VALUABLES.   Contacts, dentures or bridgework may not be worn into surgery.  DO NOT BRING YOUR HOME MEDICATIONS TO THE HOSPITAL. PHARMACY WILL DISPENSE MEDICATIONS LISTED ON YOUR MEDICATION LIST TO YOU DURING YOUR ADMISSION IN THE HOSPITAL!    Patients discharged on the day of surgery will not be allowed to drive home.  Someone NEEDS to stay with you for the first 24 hours after anesthesia.   Special Instructions: Bring a copy of your healthcare power of attorney and living will documents the day of surgery if you haven't scanned them before.              Please read over the following fact sheets you were given: IF YOU HAVE QUESTIONS ABOUT YOUR PRE-OP INSTRUCTIONS PLEASE CALL  [phone] Gwen  If you received a COVID test during your pre-op visit  it is requested that you wear a mask when out in public, stay away from anyone that may not be feeling well and notify your surgeon if you develop symptoms. If you test positive for Covid or have been in contact with anyone that has tested positive in the last 10 days please notify you surgeon.   Pre-operative 4 CHG Bath Instructions  DYNA-Hex 4 Chlorhexidine Gluconate 4% Solution Antiseptic 4 fl. oz   You can play a key role in reducing the risk of infection after surgery. Your skin needs to be as free of germs as possible. You can reduce the number of germs on your skin by washing with CHG (chlorhexidine gluconate) soap before surgery. CHG is an antiseptic soap that kills germs and continues to kill germs even after washing.   DO NOT use if you have an allergy to chlorhexidine/CHG or antibacterial soaps. If your skin becomes reddened or irritated, stop using the CHG and notify one of our RNs at   Please shower with the CHG soap starting 4 days before surgery using the following schedule:     Please keep in mind the following:  DO NOT shave, including legs and underarms, starting the day of your first shower.   You may shave your face at any point before/day of surgery.  Place clean sheets on your bed the day you start using CHG soap. Use a clean washcloth (not used since being washed) for each shower. DO NOT sleep with pets  once you start using the CHG.  CHG Shower Instructions:  If you choose to wash your hair and private area, wash first with your normal shampoo/soap.  After you use shampoo/soap, rinse your hair and body thoroughly to remove shampoo/soap residue.  Turn the water OFF and apply about 3 tablespoons (45 ml) of CHG soap to a CLEAN washcloth.  Apply CHG soap ONLY FROM YOUR NECK DOWN TO YOUR TOES (washing for 3-5 minutes)  DO NOT use CHG soap on face, private areas, open wounds, or sores.  Pay special  attention to the area where your surgery is being performed.  If you are having back surgery, having someone wash your back for you may be helpful. Wait 2 minutes after CHG soap is applied, then you may rinse off the CHG soap.  Pat dry with a clean towel  Put on clean clothes/pajamas   If you choose to wear lotion, please use ONLY the CHG-compatible lotions on the back of this paper.     Additional instructions for the day of surgery: DO NOT APPLY any lotions, deodorants, cologne, or perfumes.   Put on clean/comfortable clothes.  Brush your teeth.  Ask your nurse before applying any prescription medications to the skin.   CHG Compatible Lotions   Aveeno Moisturizing lotion  Cetaphil Moisturizing Cream  Cetaphil Moisturizing Lotion  Clairol Herbal Essence Moisturizing Lotion, Dry Skin  Clairol Herbal Essence Moisturizing Lotion, Extra Dry Skin  Clairol Herbal Essence Moisturizing Lotion, Normal Skin  Curel Age Defying Therapeutic Moisturizing Lotion with Alpha Hydroxy  Curel Extreme Care Body Lotion  Curel Soothing Hands Moisturizing Hand Lotion  Curel Therapeutic Moisturizing Cream, Fragrance-Free  Curel Therapeutic Moisturizing Lotion, Fragrance-Free  Curel Therapeutic Moisturizing Lotion, Original Formula  Eucerin Daily Replenishing Lotion  Eucerin Dry Skin Therapy Plus Alpha Hydroxy Crme  Eucerin Dry Skin Therapy Plus Alpha Hydroxy Lotion  Eucerin Original Crme  Eucerin Original Lotion  Eucerin Plus Crme Eucerin Plus Lotion  Eucerin TriLipid Replenishing Lotion  Keri Anti-Bacterial Hand Lotion  Keri Deep Conditioning Original Lotion Dry Skin Formula Softly Scented  Keri Deep Conditioning Original Lotion, Fragrance Free Sensitive Skin Formula  Keri Lotion Fast Absorbing Fragrance Free Sensitive Skin Formula  Keri Lotion Fast Absorbing Softly Scented Dry Skin Formula  Keri Original Lotion  Keri Skin Renewal Lotion Keri Silky Smooth Lotion  Keri Silky Smooth Sensitive  Skin Lotion  Nivea Body Creamy Conditioning Oil  Nivea Body Extra Enriched Lotion  Nivea Body Original Lotion  Nivea Body Sheer Moisturizing Lotion Nivea Crme  Nivea Skin Firming Lotion  NutraDerm 30 Skin Lotion  NutraDerm Skin Lotion  NutraDerm Therapeutic Skin Cream  NutraDerm Therapeutic Skin Lotion  ProShield Protective Hand Cream  Provon moisturizing lotion   PATIENT SIGNATURE_________________________________  NURSE SIGNATURE__________________________________  ________________________________________________________________________    Katherine Reed  An incentive spirometer is a tool that can help keep your lungs clear and active. This tool measures how well you are filling your lungs with each breath. Taking long deep breaths may help reverse or decrease the chance of developing breathing (pulmonary) problems (especially infection) following: A long period of time when you are unable to move or be active. BEFORE THE PROCEDURE  If the spirometer includes an indicator to show your best effort, your nurse or respiratory therapist will set it to a desired goal. If possible, sit up straight or lean slightly forward. Try not to slouch. Hold the incentive spirometer in an upright position. INSTRUCTIONS FOR USE  Sit on the edge of your  bed if possible, or sit up as far as you can in bed or on a chair. Hold the incentive spirometer in an upright position. Breathe out normally. Place the mouthpiece in your mouth and seal your lips tightly around it. Breathe in slowly and as deeply as possible, raising the piston or the ball toward the top of the column. Hold your breath for 3-5 seconds or for as long as possible. Allow the piston or ball to fall to the bottom of the column. Remove the mouthpiece from your mouth and breathe out normally. Rest for a few seconds and repeat Steps 1 through 7 at least 10 times every 1-2 hours when you are awake. Take your time and take a few normal  breaths between deep breaths. The spirometer may include an indicator to show your best effort. Use the indicator as a goal to work toward during each repetition. After each set of 10 deep breaths, practice coughing to be sure your lungs are clear. If you have an incision (the cut made at the time of surgery), support your incision when coughing by placing a pillow or rolled up towels firmly against it. Once you are able to get out of bed, walk around indoors and cough well. You may stop using the incentive spirometer when instructed by your caregiver.  RISKS AND COMPLICATIONS Take your time so you do not get dizzy or light-headed. If you are in pain, you may need to take or ask for pain medication before doing incentive spirometry. It is harder to take a deep breath if you are having pain. AFTER USE Rest and breathe slowly and easily. It can be helpful to keep track of a log of your progress. Your caregiver can provide you with a simple table to help with this. If you are using the spirometer at home, follow these instructions: SEEK MEDICAL CARE IF:  You are having difficultly using the spirometer. You have trouble using the spirometer as often as instructed. Your pain medication is not giving enough relief while using the spirometer. You develop fever of 100.5 F (38.1 C) or higher. SEEK IMMEDIATE MEDICAL CARE IF:  You cough up bloody sputum that had not been present before. You develop fever of 102 F (38.9 C) or greater. You develop worsening pain at or near the incision site. MAKE SURE YOU:  Understand these instructions. Will watch your condition. Will get help right away if you are not doing well or get worse. Document Released: 09/01/2006 Document Revised: 07/14/2011 Document Reviewed: 11/02/2006 Exit[employer] Patient Information 2014 [employer], [place].   ________________________________________________________________________ WHAT IS A BLOOD TRANSFUSION? Blood Transfusion  Information  A transfusion is the replacement of blood or some of its parts. Blood is made up of multiple cells which provide different functions. Red blood cells carry oxygen and are used for blood loss replacement. White blood cells fight against infection. Platelets control bleeding. Plasma helps clot blood. Other blood products are available for specialized needs, such as hemophilia or other clotting disorders. BEFORE THE TRANSFUSION  Who gives blood for transfusions?  Healthy volunteers who are fully evaluated to make sure their blood is safe. This is blood bank blood. Transfusion therapy is the safest it has ever been in the practice of medicine. Before blood is taken from a donor, a complete history is taken to make sure that person has no history of diseases nor engages in risky social behavior (examples are intravenous drug use or sexual activity with multiple partners). The donor's travel history  is screened to minimize risk of transmitting infections, such as malaria. The donated blood is tested for signs of infectious diseases, such as HIV and hepatitis. The blood is then tested to be sure it is compatible with you in order to minimize the chance of a transfusion reaction. If you or a relative donates blood, this is often done in anticipation of surgery and is not appropriate for emergency situations. It takes many days to process the donated blood. RISKS AND COMPLICATIONS Although transfusion therapy is very safe and saves many lives, the main dangers of transfusion include:  Getting an infectious disease. Developing a transfusion reaction. This is an allergic reaction to something in the blood you were given. Every precaution is taken to prevent this. The decision to have a blood transfusion has been considered carefully by your caregiver before blood is given. Blood is not given unless the benefits outweigh the risks. AFTER THE TRANSFUSION Right after receiving a blood transfusion,  you will usually feel much better and more energetic. This is especially true if your red blood cells have gotten low (anemic). The transfusion raises the level of the red blood cells which carry oxygen, and this usually causes an energy increase. The nurse administering the transfusion will monitor you carefully for complications. HOME CARE INSTRUCTIONS  No special instructions are needed after a transfusion. You may find your energy is better. Speak with your caregiver about any limitations on activity for underlying diseases you may have. SEEK MEDICAL CARE IF:  Your condition is not improving after your transfusion. You develop redness or irritation at the intravenous (IV) site. SEEK IMMEDIATE MEDICAL CARE IF:  Any of the following symptoms occur over the next 12 hours: Shaking chills. You have a temperature by mouth above 102 F (38.9 C), not controlled by medicine. Chest, back, or muscle pain. People around you feel you are not acting correctly or are confused. Shortness of breath or difficulty breathing. Dizziness and fainting. You get a rash or develop hives. You have a decrease in urine output. Your urine turns a dark color or changes to pink, red, or brown. Any of the following symptoms occur over the next 10 days: You have a temperature by mouth above 102 F (38.9 C), not controlled by medicine. Shortness of breath. Weakness after normal activity. The white part of the eye turns yellow (jaundice). You have a decrease in the amount of urine or are urinating less often. Your urine turns a dark color or changes to pink, red, or brown. Document Released: 04/18/2000 Document Revised: 07/14/2011 Document Reviewed: 12/06/2007 [facility] Patient Information 2014 [place], [place].  _______________________________________________________________________

## 2024-02-24 NOTE — Progress Notes (Signed)
 Surgery orders requested via Epic inbox.

## 2024-02-29 ENCOUNTER — Ambulatory Visit: Payer: Self-pay | Admitting: Student

## 2024-03-01 ENCOUNTER — Other Ambulatory Visit: Payer: Self-pay

## 2024-03-01 ENCOUNTER — Encounter (HOSPITAL_COMMUNITY): Payer: Self-pay | Admitting: *Deleted

## 2024-03-01 ENCOUNTER — Encounter (HOSPITAL_COMMUNITY)
Admission: RE | Admit: 2024-03-01 | Discharge: 2024-03-01 | Disposition: A | Discharge status: Home / Self Care | Source: Ambulatory Visit | Attending: Orthopedic Surgery | Admitting: Orthopedic Surgery

## 2024-03-01 VITALS — BP 145/75 | HR 82 | Temp 98.0°F | Resp 16 | Ht 65.0 in | Wt 159.0 lb

## 2024-03-01 DIAGNOSIS — Z01818 Encounter for other preprocedural examination: Secondary | ICD-10-CM | POA: Insufficient documentation

## 2024-03-01 DIAGNOSIS — I10 Essential (primary) hypertension: Secondary | ICD-10-CM | POA: Insufficient documentation

## 2024-03-01 HISTORY — DX: Nausea with vomiting, unspecified: R11.2

## 2024-03-01 HISTORY — DX: Other specified postprocedural states: Z98.890

## 2024-03-01 LAB — CBC
HCT: 33.5 % — ABNORMAL LOW (ref 36.0–46.0)
Hemoglobin: 10.7 g/dL — ABNORMAL LOW (ref 12.0–15.0)
MCH: 29.7 pg (ref 26.0–34.0)
MCHC: 31.9 g/dL (ref 30.0–36.0)
MCV: 93.1 fL (ref 80.0–100.0)
Platelets: 322 K/uL (ref 150–400)
RBC: 3.6 MIL/uL — ABNORMAL LOW (ref 3.87–5.11)
RDW: 11.8 % (ref 11.5–15.5)
WBC: 7.5 K/uL (ref 4.0–10.5)
nRBC: 0 % (ref 0.0–0.2)

## 2024-03-01 LAB — SURGICAL PCR SCREEN
MRSA, PCR: NEGATIVE
Staphylococcus aureus: NEGATIVE

## 2024-03-01 LAB — BASIC METABOLIC PANEL WITH GFR
Anion gap: 12 (ref 5–15)
BUN: 16 mg/dL (ref 8–23)
CO2: 22 mmol/L (ref 22–32)
Calcium: 9.7 mg/dL (ref 8.9–10.3)
Chloride: 98 mmol/L (ref 98–111)
Creatinine, Ser: 1.01 mg/dL — ABNORMAL HIGH (ref 0.44–1.00)
GFR, Estimated: 57 mL/min — ABNORMAL LOW (ref >60)
Glucose, Bld: 90 mg/dL (ref 70–99)
Potassium: 4.5 mmol/L (ref 3.5–5.1)
Sodium: 131 mmol/L — ABNORMAL LOW (ref 135–145)

## 2024-03-01 NOTE — Progress Notes (Addendum)
  Date of COVID positive in last 90 days:  No  PCP - Corean Duke, FNP Cardiologist - N/A  Chest x-ray - N/A EKG - 03-01-24 Epic Stress Test - Yes, several years ago ECHO - N/A Cardiac Cath - N/A Pacemaker/ICD device last checked:N/A Spinal Cord Stimulator:N/A  Bowel Prep - N/A  Sleep Study - N/A CPAP -   Fasting Blood Sugar - N/A Checks Blood Sugar _____ times a day  Last dose of GLP1 agonist-  N/A GLP1 instructions:  Do not take after     Last dose of SGLT-2 inhibitors-  N/A SGLT-2 instructions:  Do not take after    Blood Thinner Instructions: N/A Last dose:   Time: Aspirin Instructions:N/A Last Dose:  Activity level:  Unable to climb stairs due to severe hip pain.  Able to perform activities of daily living without stopping and without symptoms of chest pain or shortness of breath.  Anesthesia review:  N/A  Patient denies shortness of breath, fever, cough and chest pain at PAT appointment  Patient verbalized understanding of instructions that were given to them at the PAT appointment. Patient was also instructed that they will need to review over the PAT instructions again at home before surgery.

## 2024-03-08 ENCOUNTER — Ambulatory Visit: Payer: Self-pay | Admitting: Student

## 2024-03-08 NOTE — H&P (Signed)
 TOTAL HIP ADMISSION H&P  Patient is admitted for right total hip arthroplasty.  Subjective:  Chief Complaint: right hip pain  HPI: Katherine Reed, 78 y.o. female, has a history of pain and functional disability in the right hip(s) due to arthritis and avascular necrosis and patient has failed non-surgical conservative treatments for greater than 12 weeks to include NSAID's and/or analgesics, flexibility and strengthening excercises, use of assistive devices, and activity modification.  Onset of symptoms was gradual starting 10 years ago with rapidlly worsening course since that time.The patient noted no past surgery on the right hip(s).  Patient currently rates pain in the right hip at 10 out of 10 with activity. Patient has night pain, worsening of pain with activity and weight bearing, trendelenberg gait, pain that interfers with activities of daily living, and pain with passive range of motion. Patient has evidence of subchondral cysts, subchondral sclerosis, periarticular osteophytes, and joint space narrowing by imaging studies. This condition presents safety issues increasing the risk of falls.  There is no current active infection.  Patient Active Problem List   Diagnosis Date Noted   HNP (herniated nucleus pulposus), lumbar 09/01/2013   Past Medical History:  Diagnosis Date   Anemia    Anxiety    Arthritis    Cancer (HCC)    melomona   Fibromyalgia    GERD (gastroesophageal reflux disease)    Hypertension    PONV (postoperative nausea and vomiting)     Past Surgical History:  Procedure Laterality Date   ABDOMINAL HYSTERECTOMY     BACK SURGERY  lumbar fusion   CERVICAL FUSION     LUMBAR LAMINECTOMY/DECOMPRESSION MICRODISCECTOMY Left 09/01/2013   Procedure: LUMBAR LAMINECTOMY/DECOMPRESSION MICRODISCECTOMY 1 LEVEL;  Surgeon: Fairy Levels, MD;  Location: [facility];  Service: Neurosurgery;  Laterality: Left;  Left Lumbar Four-Five Redo Microdiskectomy   removal of melonoma       Current Outpatient Medications  Medication Sig Dispense Refill Last Dose/Taking   acetaminophen  (TYLENOL ) 650 MG CR tablet Take 650 mg by mouth daily.      ALPRAZolam  (XANAX ) 0.5 MG tablet Take 0.5 mg by mouth 2 (two) times daily.      amLODipine  (NORVASC ) 5 MG tablet Take 5 mg by mouth daily.       ascorbic acid (VITAMIN C) 500 MG tablet Take 500 mg by mouth daily.      benazepril (LOTENSIN) 20 MG tablet Take 20 mg by mouth daily.      cetirizine (ZYRTEC) 10 MG tablet Take 10 mg by mouth daily.      Cholecalciferol  (VITAMIN D3) 2000 UNITS TABS Take 2,000 Units by mouth daily.      estradiol  (ESTRACE ) 1 MG tablet Take 1 mg by mouth daily.       meclizine (ANTIVERT) 25 MG tablet Take 25 mg by mouth 3 (three) times daily as needed for dizziness.      omeprazole (PRILOSEC) 40 MG capsule Take 40 mg by mouth daily.       pregabalin (LYRICA) 50 MG capsule Take 50 mg by mouth 2 (two) times daily.      rosuvastatin (CRESTOR) 10 MG tablet Take 10 mg by mouth daily.      No current facility-administered medications for this visit.   No Known Allergies  Social History   Tobacco Use   Smoking status: Never   Smokeless tobacco: Not on file  Substance Use Topics   Alcohol use: No    No family history on file.   Review  of Systems  Musculoskeletal:  Positive for arthralgias and gait problem.  All other systems reviewed and are negative.   Objective:  Physical Exam Constitutional:      Appearance: Normal appearance.  HENT:     Head: Normocephalic and atraumatic.     Nose: Nose normal.     Mouth/Throat:     Mouth: Mucous membranes are moist.     Pharynx: Oropharynx is clear.  Eyes:     Conjunctiva/sclera: Conjunctivae normal.  Cardiovascular:     Rate and Rhythm: Normal rate and regular rhythm.     Pulses: Normal pulses.     Heart sounds: Normal heart sounds.  Pulmonary:     Effort: Pulmonary effort is normal.     Breath sounds: Normal breath sounds.  Abdominal:     General:  Abdomen is flat.     Palpations: Abdomen is soft.  Genitourinary:    Comments: Deferred.  Musculoskeletal:     Cervical back: Normal range of motion and neck supple.     Comments: Examination of the right hip reveals no skin wounds or lesions. She has trochanteric tenderness. Pain with manipulation of the hip. Pain in positon of impingement.  35 degree flexion contracture, I can flex her up to 95 degrees, external 10 degrees, internal 15 degrees with severe pain.   Distally, there is no focal motor or sensory deficit. Palpable pedal pulses  Skin:    General: Skin is warm and dry.     Capillary Refill: Capillary refill takes less than 2 seconds.  Neurological:     General: No focal deficit present.     Mental Status: She is alert and oriented to person, place, and time.  Psychiatric:        Mood and Affect: Mood normal.        Behavior: Behavior normal.        Thought Content: Thought content normal.        Judgment: Judgment normal.     Vital signs in last 24 hours: @VSRANGES @  Labs:   Estimated body mass index is 26.46 kg/m as calculated from the following:   Height as of 03/01/24: 5' 5 (1.651 m).   Weight as of 03/01/24: 72.1 kg.   Imaging Review Plain radiographs demonstrate severe degenerative joint disease of the right hip(s). The bone quality appears to be adequate for age and reported activity level.      Assessment/Plan:  End stage arthritis, right hip(s)  The patient history, physical examination, clinical judgement of the provider and imaging studies are consistent with end stage degenerative joint disease of the right hip(s) and total hip arthroplasty is deemed medically necessary. The treatment options including medical management, injection therapy, arthroscopy and arthroplasty were discussed at length. The risks and benefits of total hip arthroplasty were presented and reviewed. The risks due to aseptic loosening, infection, stiffness,  dislocation/subluxation,  thromboembolic complications and other imponderables were discussed.  The patient acknowledged the explanation, agreed to proceed with the plan and consent was signed. Patient is being admitted for inpatient treatment for surgery, pain control, PT, OT, prophylactic antibiotics, VTE prophylaxis, progressive ambulation and ADL's and discharge planning.The patient is planning to be discharged home with HEP.   Therapy Plans: HEP.  Disposition: Home with daughter Suzen.  Planned DVT Prophylaxis: aspirin 81mg  BID DME needed: Has rolling walker.  PCP: Cleared.  TXA: IV Allergies:  - terbinafine - rash.  Anesthesia Concerns: None. History of nausea and vomiting.  BMI: 27 Last HgbA1c: Not  diabetic. Other: - Hx vertigo, meclizine.  - Has Pessary.  - Hydrocodone , zofran , has gabapentin.  - 03/01/24: Hgb 10.7, K+ 4.5, Cr. 1.01. - [facility].     Patient's anticipated LOS is less than 2 midnights, meeting these requirements: - Younger than 25 - Lives within 1 hour of care - Has a competent adult at home to recover with post-op recover - NO history of  - Chronic pain requiring opiods  - Diabetes  - Coronary Artery Disease  - Heart failure  - Heart attack  - Stroke  - DVT/VTE  - Cardiac arrhythmia  - Respiratory Failure/COPD  - Renal failure  - Anemia  - Advanced Liver disease

## 2024-03-08 NOTE — H&P (View-Only) (Signed)
 TOTAL HIP ADMISSION H&P  Patient is admitted for right total hip arthroplasty.  Subjective:  Chief Complaint: right hip pain  HPI: Katherine Reed, 78 y.o. female, has a history of pain and functional disability in the right hip(s) due to arthritis and avascular necrosis and patient has failed non-surgical conservative treatments for greater than 12 weeks to include NSAID's and/or analgesics, flexibility and strengthening excercises, use of assistive devices, and activity modification.  Onset of symptoms was gradual starting 10 years ago with rapidlly worsening course since that time.The patient noted no past surgery on the right hip(s).  Patient currently rates pain in the right hip at 10 out of 10 with activity. Patient has night pain, worsening of pain with activity and weight bearing, trendelenberg gait, pain that interfers with activities of daily living, and pain with passive range of motion. Patient has evidence of subchondral cysts, subchondral sclerosis, periarticular osteophytes, and joint space narrowing by imaging studies. This condition presents safety issues increasing the risk of falls.  There is no current active infection.  Patient Active Problem List   Diagnosis Date Noted   HNP (herniated nucleus pulposus), lumbar 09/01/2013   Past Medical History:  Diagnosis Date   Anemia    Anxiety    Arthritis    Cancer (HCC)    melomona   Fibromyalgia    GERD (gastroesophageal reflux disease)    Hypertension    PONV (postoperative nausea and vomiting)     Past Surgical History:  Procedure Laterality Date   ABDOMINAL HYSTERECTOMY     BACK SURGERY  lumbar fusion   CERVICAL FUSION     LUMBAR LAMINECTOMY/DECOMPRESSION MICRODISCECTOMY Left 09/01/2013   Procedure: LUMBAR LAMINECTOMY/DECOMPRESSION MICRODISCECTOMY 1 LEVEL;  Surgeon: Fairy Levels, MD;  Location: [facility];  Service: Neurosurgery;  Laterality: Left;  Left Lumbar Four-Five Redo Microdiskectomy   removal of melonoma       Current Outpatient Medications  Medication Sig Dispense Refill Last Dose/Taking   acetaminophen  (TYLENOL ) 650 MG CR tablet Take 650 mg by mouth daily.      ALPRAZolam  (XANAX ) 0.5 MG tablet Take 0.5 mg by mouth 2 (two) times daily.      amLODipine  (NORVASC ) 5 MG tablet Take 5 mg by mouth daily.       ascorbic acid (VITAMIN C) 500 MG tablet Take 500 mg by mouth daily.      benazepril (LOTENSIN) 20 MG tablet Take 20 mg by mouth daily.      cetirizine (ZYRTEC) 10 MG tablet Take 10 mg by mouth daily.      Cholecalciferol  (VITAMIN D3) 2000 UNITS TABS Take 2,000 Units by mouth daily.      estradiol  (ESTRACE ) 1 MG tablet Take 1 mg by mouth daily.       meclizine (ANTIVERT) 25 MG tablet Take 25 mg by mouth 3 (three) times daily as needed for dizziness.      omeprazole (PRILOSEC) 40 MG capsule Take 40 mg by mouth daily.       pregabalin (LYRICA) 50 MG capsule Take 50 mg by mouth 2 (two) times daily.      rosuvastatin (CRESTOR) 10 MG tablet Take 10 mg by mouth daily.      No current facility-administered medications for this visit.   No Known Allergies  Social History   Tobacco Use   Smoking status: Never   Smokeless tobacco: Not on file  Substance Use Topics   Alcohol use: No    No family history on file.   Review  of Systems  Musculoskeletal:  Positive for arthralgias and gait problem.  All other systems reviewed and are negative.   Objective:  Physical Exam Constitutional:      Appearance: Normal appearance.  HENT:     Head: Normocephalic and atraumatic.     Nose: Nose normal.     Mouth/Throat:     Mouth: Mucous membranes are moist.     Pharynx: Oropharynx is clear.  Eyes:     Conjunctiva/sclera: Conjunctivae normal.  Cardiovascular:     Rate and Rhythm: Normal rate and regular rhythm.     Pulses: Normal pulses.     Heart sounds: Normal heart sounds.  Pulmonary:     Effort: Pulmonary effort is normal.     Breath sounds: Normal breath sounds.  Abdominal:     General:  Abdomen is flat.     Palpations: Abdomen is soft.  Genitourinary:    Comments: Deferred.  Musculoskeletal:     Cervical back: Normal range of motion and neck supple.     Comments: Examination of the right hip reveals no skin wounds or lesions. She has trochanteric tenderness. Pain with manipulation of the hip. Pain in positon of impingement.  35 degree flexion contracture, I can flex her up to 95 degrees, external 10 degrees, internal 15 degrees with severe pain.   Distally, there is no focal motor or sensory deficit. Palpable pedal pulses  Skin:    General: Skin is warm and dry.     Capillary Refill: Capillary refill takes less than 2 seconds.  Neurological:     General: No focal deficit present.     Mental Status: She is alert and oriented to person, place, and time.  Psychiatric:        Mood and Affect: Mood normal.        Behavior: Behavior normal.        Thought Content: Thought content normal.        Judgment: Judgment normal.     Vital signs in last 24 hours: @VSRANGES @  Labs:   Estimated body mass index is 26.46 kg/m as calculated from the following:   Height as of 03/01/24: 5' 5 (1.651 m).   Weight as of 03/01/24: 72.1 kg.   Imaging Review Plain radiographs demonstrate severe degenerative joint disease of the right hip(s). The bone quality appears to be adequate for age and reported activity level.      Assessment/Plan:  End stage arthritis, right hip(s)  The patient history, physical examination, clinical judgement of the provider and imaging studies are consistent with end stage degenerative joint disease of the right hip(s) and total hip arthroplasty is deemed medically necessary. The treatment options including medical management, injection therapy, arthroscopy and arthroplasty were discussed at length. The risks and benefits of total hip arthroplasty were presented and reviewed. The risks due to aseptic loosening, infection, stiffness,  dislocation/subluxation,  thromboembolic complications and other imponderables were discussed.  The patient acknowledged the explanation, agreed to proceed with the plan and consent was signed. Patient is being admitted for inpatient treatment for surgery, pain control, PT, OT, prophylactic antibiotics, VTE prophylaxis, progressive ambulation and ADL's and discharge planning.The patient is planning to be discharged home with HEP.   Therapy Plans: HEP.  Disposition: Home with daughter Suzen.  Planned DVT Prophylaxis: aspirin 81mg  BID DME needed: Has rolling walker.  PCP: Cleared.  TXA: IV Allergies:  - terbinafine - rash.  Anesthesia Concerns: None. History of nausea and vomiting.  BMI: 27 Last HgbA1c: Not  diabetic. Other: - Hx vertigo, meclizine.  - Has Pessary.  - Hydrocodone , zofran , has gabapentin.  - 03/01/24: Hgb 10.7, K+ 4.5, Cr. 1.01. - [facility].     Patient's anticipated LOS is less than 2 midnights, meeting these requirements: - Younger than 25 - Lives within 1 hour of care - Has a competent adult at home to recover with post-op recover - NO history of  - Chronic pain requiring opiods  - Diabetes  - Coronary Artery Disease  - Heart failure  - Heart attack  - Stroke  - DVT/VTE  - Cardiac arrhythmia  - Respiratory Failure/COPD  - Renal failure  - Anemia  - Advanced Liver disease

## 2024-03-08 NOTE — Anesthesia Preprocedure Evaluation (Signed)
 Anesthesia Evaluation  Patient identified by MRN, date of birth, ID band Patient awake    Reviewed: Allergy & Precautions, NPO status , Patient's Chart, lab work & pertinent test results  History of Anesthesia Complications (+) PONV and history of anesthetic complications  Airway Mallampati: IV  TM Distance: >3 FB Neck ROM: Full    Dental  (+) Teeth Intact, Dental Advisory Given   Pulmonary neg pulmonary ROS   Pulmonary exam normal breath sounds clear to auscultation       Cardiovascular hypertension (144/72 preop), Pt. on medications Normal cardiovascular exam Rhythm:Regular Rate:Normal     Neuro/Psych  PSYCHIATRIC DISORDERS Anxiety     S/P cervical fusion, s/p lumbar laminectomy    GI/Hepatic Neg liver ROS,GERD  Medicated and Controlled,,  Endo/Other  negative endocrine ROS    Renal/GU negative Renal ROS  negative genitourinary   Musculoskeletal  (+) Arthritis ,  Fibromyalgia -Lumbar laminectomy 2015   Abdominal   Peds  Hematology  (+) Blood dyscrasia, anemia Hb 10.7, plt 322   Anesthesia Other Findings   Reproductive/Obstetrics negative OB ROS                              Anesthesia Physical Anesthesia Plan  ASA: 2  Anesthesia Plan: Spinal   Post-op Pain Management: Tylenol  PO (pre-op)*   Induction:   PONV Risk Score and Plan: 4 or greater and Treatment may vary due to age or medical condition, Ondansetron , Propofol  infusion and Dexamethasone  Airway Management Planned: Natural Airway and Simple Face Mask  Additional Equipment: None  Intra-op Plan:   Post-operative Plan:   Informed Consent: I have reviewed the patients History and Physical, chart, labs and discussed the procedure including the risks, benefits and alternatives for the proposed anesthesia with the patient or authorized representative who has indicated his/her understanding and acceptance.       Plan  Discussed with: CRNA  Anesthesia Plan Comments:          Anesthesia Quick Evaluation

## 2024-03-09 ENCOUNTER — Ambulatory Visit (HOSPITAL_COMMUNITY)
Admission: RE | Admit: 2024-03-09 | Discharge: 2024-03-09 | Disposition: A | Discharge status: Home / Self Care | Source: Ambulatory Visit | Attending: Orthopedic Surgery | Admitting: Orthopedic Surgery

## 2024-03-09 ENCOUNTER — Ambulatory Visit (HOSPITAL_COMMUNITY)

## 2024-03-09 ENCOUNTER — Encounter (HOSPITAL_COMMUNITY): Admission: RE | Disposition: A | Payer: Self-pay | Source: Ambulatory Visit | Attending: Orthopedic Surgery

## 2024-03-09 ENCOUNTER — Encounter (HOSPITAL_COMMUNITY): Payer: Self-pay | Admitting: Orthopedic Surgery

## 2024-03-09 ENCOUNTER — Other Ambulatory Visit (HOSPITAL_COMMUNITY): Payer: Self-pay

## 2024-03-09 ENCOUNTER — Ambulatory Visit (HOSPITAL_COMMUNITY): Payer: Self-pay | Admitting: Anesthesiology

## 2024-03-09 ENCOUNTER — Ambulatory Visit (HOSPITAL_BASED_OUTPATIENT_CLINIC_OR_DEPARTMENT_OTHER): Payer: Self-pay | Admitting: Anesthesiology

## 2024-03-09 ENCOUNTER — Other Ambulatory Visit: Payer: Self-pay

## 2024-03-09 DIAGNOSIS — F419 Anxiety disorder, unspecified: Secondary | ICD-10-CM | POA: Diagnosis not present

## 2024-03-09 DIAGNOSIS — I10 Essential (primary) hypertension: Secondary | ICD-10-CM | POA: Diagnosis not present

## 2024-03-09 DIAGNOSIS — K219 Gastro-esophageal reflux disease without esophagitis: Secondary | ICD-10-CM | POA: Diagnosis not present

## 2024-03-09 DIAGNOSIS — Z79899 Other long term (current) drug therapy: Secondary | ICD-10-CM | POA: Diagnosis not present

## 2024-03-09 DIAGNOSIS — M1611 Unilateral primary osteoarthritis, right hip: Secondary | ICD-10-CM | POA: Insufficient documentation

## 2024-03-09 DIAGNOSIS — M797 Fibromyalgia: Secondary | ICD-10-CM | POA: Insufficient documentation

## 2024-03-09 DIAGNOSIS — Z981 Arthrodesis status: Secondary | ICD-10-CM | POA: Insufficient documentation

## 2024-03-09 DIAGNOSIS — M87051 Idiopathic aseptic necrosis of right femur: Secondary | ICD-10-CM | POA: Diagnosis present

## 2024-03-09 HISTORY — PX: TOTAL HIP ARTHROPLASTY: SHX124

## 2024-03-09 LAB — TYPE AND SCREEN
ABO/RH(D): O NEG
Antibody Screen: NEGATIVE

## 2024-03-09 LAB — ABO/RH: ABO/RH(D): O NEG

## 2024-03-09 SURGERY — ARTHROPLASTY, HIP, TOTAL, ANTERIOR APPROACH
Anesthesia: Spinal | Site: Hip | Laterality: Right

## 2024-03-09 MED ORDER — ACETAMINOPHEN 500 MG PO TABS: 1000.0000 mg | ORAL_TABLET | Freq: Once | ORAL | Status: DC

## 2024-03-09 MED ORDER — BUPIVACAINE-EPINEPHRINE (PF) 0.25% -1:200000 IJ SOLN
INTRAMUSCULAR | Status: AC
Start: 2024-03-09 — End: 2024-03-09
  Filled 2024-03-09: qty 30

## 2024-03-09 MED ORDER — METHOCARBAMOL 1000 MG/10ML IJ SOLN: 500.0000 mg | Freq: Four times a day (QID) | INTRAMUSCULAR | Status: DC | PRN

## 2024-03-09 MED ORDER — SODIUM CHLORIDE (PF) 0.9 % IJ SOLN: INTRAMUSCULAR | Status: DC | PRN

## 2024-03-09 MED ORDER — PROPOFOL 1000 MG/100ML IV EMUL
INTRAVENOUS | Status: AC
  Filled 2024-03-09: qty 100

## 2024-03-09 MED ORDER — HYDROCODONE-ACETAMINOPHEN 7.5-325 MG PO TABS
1.0000 | ORAL_TABLET | ORAL | Status: DC | PRN
  Administered 2024-03-09: 1 via ORAL

## 2024-03-09 MED ORDER — SODIUM CHLORIDE 0.9 % IR SOLN
Status: DC | PRN
  Administered 2024-03-09: 3000 mL

## 2024-03-09 MED ORDER — PROPOFOL 500 MG/50ML IV EMUL
INTRAVENOUS | Status: DC | PRN
  Administered 2024-03-09: 40 ug/kg/min via INTRAVENOUS

## 2024-03-09 MED ORDER — ORAL CARE MOUTH RINSE: 15.0000 mL | Freq: Once | OROMUCOSAL | Status: AC

## 2024-03-09 MED ORDER — FENTANYL CITRATE (PF) 50 MCG/ML IJ SOSY
25.0000 ug | PREFILLED_SYRINGE | INTRAMUSCULAR | Status: DC | PRN
  Administered 2024-03-09 (×2): 50 ug via INTRAVENOUS

## 2024-03-09 MED ORDER — FENTANYL CITRATE (PF) 100 MCG/2ML IJ SOLN
INTRAMUSCULAR | Status: DC | PRN
  Administered 2024-03-09 (×2): 50 ug via INTRAVENOUS

## 2024-03-09 MED ORDER — ONDANSETRON HCL 4 MG/2ML IJ SOLN
INTRAMUSCULAR | Status: AC
  Filled 2024-03-09: qty 2

## 2024-03-09 MED ORDER — FENTANYL CITRATE (PF) 100 MCG/2ML IJ SOLN
INTRAMUSCULAR | Status: AC
  Filled 2024-03-09: qty 2

## 2024-03-09 MED ORDER — METHOCARBAMOL 500 MG PO TABS
500.0000 mg | ORAL_TABLET | Freq: Four times a day (QID) | ORAL | Status: DC | PRN
Start: 2024-03-09 — End: 2024-03-09
  Administered 2024-03-09: 500 mg via ORAL

## 2024-03-09 MED ORDER — ONDANSETRON HCL 4 MG/2ML IJ SOLN
INTRAMUSCULAR | Status: DC | PRN
  Administered 2024-03-09: 4 mg via INTRAVENOUS

## 2024-03-09 MED ORDER — LACTATED RINGERS IV BOLUS: 500.0000 mL | Freq: Once | INTRAVENOUS | Status: DC

## 2024-03-09 MED ORDER — FENTANYL CITRATE (PF) 50 MCG/ML IJ SOSY
PREFILLED_SYRINGE | INTRAMUSCULAR | Status: AC
  Filled 2024-03-09: qty 2

## 2024-03-09 MED ORDER — DEXAMETHASONE SOD PHOSPHATE PF 10 MG/ML IJ SOLN
INTRAMUSCULAR | Status: DC | PRN
  Administered 2024-03-09: 8 mg via INTRAVENOUS

## 2024-03-09 MED ORDER — ISOPROPYL ALCOHOL 70 % SOLN
Status: AC
  Filled 2024-03-09: qty 480

## 2024-03-09 MED ORDER — OXYCODONE HCL 5 MG PO TABS
ORAL_TABLET | ORAL | Status: AC
  Filled 2024-03-09: qty 1

## 2024-03-09 MED ORDER — CEFAZOLIN SODIUM-DEXTROSE 2-4 GM/100ML-% IV SOLN
INTRAVENOUS | Status: AC
  Filled 2024-03-09: qty 100

## 2024-03-09 MED ORDER — ONDANSETRON HCL 4 MG/2ML IJ SOLN: 4.0000 mg | Freq: Once | INTRAMUSCULAR | Status: DC | PRN

## 2024-03-09 MED ORDER — METHOCARBAMOL 500 MG PO TABS
ORAL_TABLET | ORAL | Status: AC
  Filled 2024-03-09: qty 1

## 2024-03-09 MED ORDER — OXYCODONE HCL 5 MG PO TABS
5.0000 mg | ORAL_TABLET | Freq: Once | ORAL | Status: AC | PRN
  Administered 2024-03-09: 5 mg via ORAL

## 2024-03-09 MED ORDER — LACTATED RINGERS IV SOLN: INTRAVENOUS | Status: DC

## 2024-03-09 MED ORDER — WATER FOR IRRIGATION, STERILE IR SOLN
Status: DC | PRN
  Administered 2024-03-09: 2000 mL

## 2024-03-09 MED ORDER — TRANEXAMIC ACID-NACL 1000-0.7 MG/100ML-% IV SOLN
1000.0000 mg | INTRAVENOUS | Status: AC
  Administered 2024-03-09: 1000 mg via INTRAVENOUS
  Filled 2024-03-09: qty 100

## 2024-03-09 MED ORDER — ISOPROPYL ALCOHOL 70 % SOLN
Status: DC | PRN
  Administered 2024-03-09: 1 via TOPICAL

## 2024-03-09 MED ORDER — HYDROCODONE-ACETAMINOPHEN 5-325 MG PO TABS
1.0000 | ORAL_TABLET | ORAL | 0 refills | Status: AC | PRN
  Filled 2024-03-09: qty 42, 7d supply, fill #0

## 2024-03-09 MED ORDER — HYDROCODONE-ACETAMINOPHEN 5-325 MG PO TABS
1.0000 | ORAL_TABLET | Freq: Four times a day (QID) | ORAL | 0 refills | Status: DC | PRN
  Filled 2024-03-09: qty 30, 8d supply, fill #0

## 2024-03-09 MED ORDER — KETOROLAC TROMETHAMINE 30 MG/ML IJ SOLN: INTRAMUSCULAR | Status: DC | PRN

## 2024-03-09 MED ORDER — POLYETHYLENE GLYCOL 3350 17 GM/SCOOP PO POWD
17.0000 g | Freq: Every day | ORAL | 0 refills | Status: AC | PRN
  Filled 2024-03-09: qty 238, 14d supply, fill #0

## 2024-03-09 MED ORDER — MELOXICAM 7.5 MG PO TABS
7.5000 mg | ORAL_TABLET | Freq: Every day | ORAL | 2 refills | Status: AC
  Filled 2024-03-09: qty 30, 30d supply, fill #0

## 2024-03-09 MED ORDER — PREGABALIN 75 MG PO CAPS: 75.0000 mg | ORAL_CAPSULE | Freq: Two times a day (BID) | ORAL | 0 refills | Status: DC

## 2024-03-09 MED ORDER — OXYCODONE HCL 5 MG/5ML PO SOLN: 5.0000 mg | Freq: Once | ORAL | Status: AC | PRN

## 2024-03-09 MED ORDER — SODIUM CHLORIDE (PF) 0.9 % IJ SOLN
INTRAMUSCULAR | Status: AC
  Filled 2024-03-09: qty 50

## 2024-03-09 MED ORDER — CEFAZOLIN SODIUM-DEXTROSE 2-4 GM/100ML-% IV SOLN
2.0000 g | INTRAVENOUS | Status: AC
  Administered 2024-03-09: 2 g via INTRAVENOUS

## 2024-03-09 MED ORDER — LIDOCAINE HCL (PF) 2 % IJ SOLN
INTRAMUSCULAR | Status: AC
  Filled 2024-03-09: qty 5

## 2024-03-09 MED ORDER — ASPIRIN 81 MG PO CHEW
81.0000 mg | CHEWABLE_TABLET | Freq: Two times a day (BID) | ORAL | 0 refills | Status: AC
  Filled 2024-03-09: qty 90, 45d supply, fill #0

## 2024-03-09 MED ORDER — LACTATED RINGERS IV BOLUS: 250.0000 mL | Freq: Once | INTRAVENOUS | Status: DC

## 2024-03-09 MED ORDER — BUPIVACAINE IN DEXTROSE 0.75-8.25 % IT SOLN
INTRATHECAL | Status: DC | PRN
Start: 2024-03-09 — End: 2024-03-09
  Administered 2024-03-09: 2 mL via INTRATHECAL

## 2024-03-09 MED ORDER — SENNA 8.6 MG PO TABS
2.0000 | ORAL_TABLET | Freq: Every day | ORAL | 1 refills | Status: AC
  Filled 2024-03-09: qty 60, 30d supply, fill #0

## 2024-03-09 MED ORDER — HYDROCODONE-ACETAMINOPHEN 7.5-325 MG PO TABS
ORAL_TABLET | ORAL | Status: AC
  Filled 2024-03-09: qty 1

## 2024-03-09 MED ORDER — 0.9 % SODIUM CHLORIDE (POUR BTL) OPTIME
TOPICAL | Status: DC | PRN
  Administered 2024-03-09: 1000 mL

## 2024-03-09 MED ORDER — AMISULPRIDE (ANTIEMETIC) 5 MG/2ML IV SOLN: 10.0000 mg | Freq: Once | INTRAVENOUS | Status: DC | PRN

## 2024-03-09 MED ORDER — KETOROLAC TROMETHAMINE 30 MG/ML IJ SOLN
INTRAMUSCULAR | Status: AC
  Filled 2024-03-09: qty 1

## 2024-03-09 MED ORDER — ONDANSETRON HCL 4 MG PO TABS
4.0000 mg | ORAL_TABLET | Freq: Three times a day (TID) | ORAL | 0 refills | Status: AC | PRN
  Filled 2024-03-09: qty 20, 7d supply, fill #0

## 2024-03-09 MED ORDER — POVIDONE-IODINE 10 % EX SWAB: 2.0000 | Freq: Once | CUTANEOUS | Status: DC

## 2024-03-09 MED ORDER — SODIUM CHLORIDE (PF) 0.9 % IJ SOLN
INTRAMUSCULAR | Status: DC | PRN
  Administered 2024-03-09: 61 mL via INTRAMUSCULAR

## 2024-03-09 MED ORDER — ACETAMINOPHEN 500 MG PO TABS
1000.0000 mg | ORAL_TABLET | Freq: Once | ORAL | Status: AC
  Administered 2024-03-09: 1000 mg via ORAL
  Filled 2024-03-09: qty 2

## 2024-03-09 MED ORDER — SODIUM CHLORIDE (PF) 0.9 % IJ SOLN
INTRAMUSCULAR | Status: AC
  Filled 2024-03-09: qty 10

## 2024-03-09 MED ORDER — DOCUSATE SODIUM 100 MG PO CAPS
100.0000 mg | ORAL_CAPSULE | Freq: Two times a day (BID) | ORAL | 1 refills | Status: AC
  Filled 2024-03-09: qty 60, 30d supply, fill #0

## 2024-03-09 MED ORDER — CHLORHEXIDINE GLUCONATE 0.12 % MT SOLN
15.0000 mL | Freq: Once | OROMUCOSAL | Status: AC
  Administered 2024-03-09: 15 mL via OROMUCOSAL

## 2024-03-09 MED ORDER — LIDOCAINE 2% (20 MG/ML) 5 ML SYRINGE
INTRAMUSCULAR | Status: DC | PRN
  Administered 2024-03-09: 20 mg via INTRAVENOUS

## 2024-03-09 MED ORDER — PHENYLEPHRINE HCL-NACL 20-0.9 MG/250ML-% IV SOLN
INTRAVENOUS | Status: DC | PRN
  Administered 2024-03-09: 25 ug/min via INTRAVENOUS

## 2024-03-09 SURGICAL SUPPLY — 49 items
BAG COUNTER SPONGE SURGICOUNT (BAG) IMPLANT
BAG ZIPLOCK 12X15 (MISCELLANEOUS) ×1 IMPLANT
BIT DRILL RINGLOC QUICK CONN (BIT) IMPLANT
BLADE SAW RECIPROCATING 77.5 (BLADE) IMPLANT
BLADE SAW SGTL 18X104X1.24 (BLADE) ×1 IMPLANT
CHLORAPREP W/TINT 26 (MISCELLANEOUS) ×1 IMPLANT
COVER PERINEAL POST (MISCELLANEOUS) ×1 IMPLANT
COVER SURGICAL LIGHT HANDLE (MISCELLANEOUS) ×1 IMPLANT
DERMABOND ADVANCED .7 DNX12 (GAUZE/BANDAGES/DRESSINGS) ×1 IMPLANT
DRAPE IMP U-DRAPE 54X76 (DRAPES) ×1 IMPLANT
DRAPE SHEET LG 3/4 BI-LAMINATE (DRAPES) ×3 IMPLANT
DRAPE STERI IOBAN 125X83 (DRAPES) ×1 IMPLANT
DRAPE U-SHAPE 47X51 STRL (DRAPES) ×2 IMPLANT
DRSG AQUACEL AG ADV 3.5X10 (GAUZE/BANDAGES/DRESSINGS) ×1 IMPLANT
ELECT REM PT RETURN 15FT ADLT (MISCELLANEOUS) ×1 IMPLANT
GAUZE SPONGE 4X4 12PLY STRL (GAUZE/BANDAGES/DRESSINGS) ×1 IMPLANT
GLOVE BIO SURGEON STRL SZ7 (GLOVE) ×1 IMPLANT
GLOVE BIO SURGEON STRL SZ8.5 (GLOVE) ×2 IMPLANT
GLOVE BIOGEL PI IND STRL 7.5 (GLOVE) ×1 IMPLANT
GLOVE BIOGEL PI IND STRL 8.5 (GLOVE) ×1 IMPLANT
GOWN SPEC L3 XXLG W/TWL (GOWN DISPOSABLE) ×1 IMPLANT
GOWN STRL REUS W/ TWL XL LVL3 (GOWN DISPOSABLE) ×1 IMPLANT
HEAD FEM MOD 36M STD (Miscellaneous) IMPLANT
HOLDER FOLEY CATH W/STRAP (MISCELLANEOUS) ×1 IMPLANT
HOOD PEEL AWAY T7 (MISCELLANEOUS) ×3 IMPLANT
KIT TURNOVER KIT A (KITS) ×1 IMPLANT
LINER ACETAB G7 E 36 +5 OFFSET (Liner) IMPLANT
MANIFOLD NEPTUNE II (INSTRUMENTS) ×1 IMPLANT
MARKER SKIN DUAL TIP RULER LAB (MISCELLANEOUS) ×1 IMPLANT
NDL SAFETY ECLIPSE 18X1.5 (NEEDLE) ×1 IMPLANT
NDL SPNL 18GX3.5 QUINCKE PK (NEEDLE) ×1 IMPLANT
NEEDLE SPNL 18GX3.5 QUINCKE PK (NEEDLE) ×1 IMPLANT
PACK ANTERIOR HIP CUSTOM (KITS) ×1 IMPLANT
PENCIL SMOKE EVACUATOR (MISCELLANEOUS) ×1 IMPLANT
SCREW BONE 6.5X25 (Screw) IMPLANT
SEALER BIPOLAR AQUA 6.0 (INSTRUMENTS) ×1 IMPLANT
SET HNDPC FAN SPRY TIP SCT (DISPOSABLE) ×1 IMPLANT
SHELL ACETAB 3H 52 E HIP (Shell) IMPLANT
SOLUTION PRONTOSAN WOUND 350ML (IRRIGATION / IRRIGATOR) ×1 IMPLANT
SPIKE FLUID TRANSFER (MISCELLANEOUS) ×1 IMPLANT
STEM FEM CMTLS 10X105 (Stem) IMPLANT
SUT MNCRL AB 3-0 PS2 18 (SUTURE) ×1 IMPLANT
SUT MON AB 2-0 CT1 36 (SUTURE) ×1 IMPLANT
SUT STRATAFIX 14 PDO 48 VLT (SUTURE) ×1 IMPLANT
SUT VIC AB 2-0 CT1 TAPERPNT 27 (SUTURE) IMPLANT
SYR 3ML LL SCALE MARK (SYRINGE) ×1 IMPLANT
TOWEL GREEN STERILE FF (TOWEL DISPOSABLE) ×1 IMPLANT
TRAY FOLEY MTR SLVR 16FR STAT (SET/KITS/TRAYS/PACK) IMPLANT
TUBE SUCTION HIGH CAP CLEAR NV (SUCTIONS) ×1 IMPLANT

## 2024-03-09 NOTE — Op Note (Signed)
 OPERATIVE REPORT  SURGEON: Redell Shoals, MD   ASSISTANT: Valery Potters, PA-C.  PREOPERATIVE DIAGNOSIS: Right hip arthritis.   POSTOPERATIVE DIAGNOSIS: Right hip arthritis.   PROCEDURE: Right total hip arthroplasty, anterior approach.   IMPLANTS: Biomet Taperloc Complete Microplasty stem, size 10 x 105 mm, high offset. Biomet G7 OsseoTi Cup, size 52 mm. Biomet Vivacit-E liner, size 36 mm, E, +5 mm neutral. Biomet metal head ball, size 36 + 0 mm. 6.5 mm bone screw x1.  ANESTHESIA:  MAC and Spinal  ESTIMATED BLOOD LOSS:-350 mL    ANTIBIOTICS: 2g Ancef .  DRAINS: None.  COMPLICATIONS: None.   CONDITION: PACU - hemodynamically stable.   BRIEF CLINICAL NOTE: Katherine Reed is a 78 y.o. female with a long-standing history of rapidly progressive Right hip arthritis. After failing conservative management, the patient was indicated for total hip arthroplasty. The risks, benefits, and alternatives to the procedure were explained, and the patient elected to proceed.  PROCEDURE IN DETAIL: Surgical site was marked by myself in the pre-op holding area. Once inside the operating room, spinal anesthesia was obtained, and a foley catheter was inserted. The patient was then positioned on the Hana table.  All bony prominences were well padded.  The hip was prepped and draped in the normal sterile surgical fashion.  A time-out was called verifying side and site of surgery. The patient received IV antibiotics within 60 minutes of beginning the procedure.   Bikini incision was made, and superficial dissection was performed lateral to the ASIS. The direct anterior approach to the hip was performed through the Hueter interval.  Lateral femoral circumflex vessels were treated with the Auqumantys. The anterior capsule was exposed and an inverted T capsulotomy was made. The femoral neck cut was made to the level of the templated cut.  A corkscrew was placed into the head and the head was removed.  The  femoral head was found to have eburnated bone. The head was passed to the back table and was measured. Pubofemoral ligament was released off of the calcar, taking care to stay on bone. Superior capsule was released from the greater trochanter, taking care to stay lateral to the posterior border of the femoral neck in order to preserve the short external rotators.   Acetabular exposure was achieved, and the pulvinar and labrum were excised. Sequential reaming of the acetabulum was then performed up to a size 49 mm reamer. A 50 mm cup was then opened and impacted into place at approximately 40 degrees of abduction and 20 degrees of anteversion. I elected to augment the already acceptable press fit fixation with a single 6.5 mm bone screw. The final polyethylene liner was impacted into place and acetabular osteophytes were removed.    I then gained femoral exposure taking care to protect the abductors and greater trochanter.  This was performed using standard external rotation, extension, and adduction.  A cookie cutter was used to enter the femoral canal, and then the femoral canal finder was placed.  Sequential broaching was performed up to a size 10.  Calcar planer was used on the femoral neck remnant.  I placed a high offset neck and a trial head ball.  The hip was reduced.  Leg lengths and offset were checked fluoroscopically.  The hip was dislocated and trial components were removed.  The final implants were placed, and the hip was reduced.  Fluoroscopy was used to confirm component position and leg lengths.  At 90 degrees of external rotation and full extension, the hip  was stable to an anterior directed force.   The wound was copiously irrigated with Prontosan solution and normal saline using pulse lavage.  Marcaine  solution was injected into the periarticular soft tissue.  The wound was closed in layers using #1 Vicryl and V-Loc for the fascia, 2-0 Vicryl for the subcutaneous fat, 2-0 Monocryl for the  deep dermal layer, 3-0 running Monocryl subcuticular stitch, and Dermabond for the skin.  Once the glue was fully dried, an Aquacell Ag dressing was applied.  The patient was transported to the recovery room in stable condition.  Sponge, needle, and instrument counts were correct at the end of the case x2.  The patient tolerated the procedure well and there were no known complications.  Please note that a surgical assistant was a medical necessity for this procedure to perform it in a safe and expeditious manner. Assistant was necessary to provide appropriate retraction of vital neurovascular structures, to prevent femoral fracture, and to allow for anatomic placement of the prosthesis.

## 2024-03-09 NOTE — Transfer of Care (Signed)
 Immediate Anesthesia Transfer of Care Note  Patient: Katherine Reed  Procedure(s) Performed: ARTHROPLASTY, HIP, TOTAL, ANTERIOR APPROACH (Right: Hip)  Patient Location: PACU  Anesthesia Type:Spinal  Level of Consciousness: awake, alert , and oriented  Airway & Oxygen Therapy: Patient Spontanous Breathing and Patient connected to face mask oxygen  Post-op Assessment: Report given to RN  Post vital signs: Reviewed and stable  Last Vitals:  Vitals Value Taken Time  BP 107/56 03/09/24 10:37  Temp    Pulse 70 03/09/24 10:39  Resp 10 03/09/24 10:39  SpO2 100 % 03/09/24 10:39  Vitals shown include unfiled device data.  Last Pain:  Vitals:   03/09/24 0715  TempSrc:   PainSc: 5       Patients Stated Pain Goal: 2 (03/09/24 0715)  Complications: No notable events documented.

## 2024-03-09 NOTE — Evaluation (Signed)
 Physical Therapy Evaluation Patient Details Name: Katherine Reed MRN: [id number] DOB: March 13, 1946 Today's Date: 03/09/2024  History of Present Illness  78 yo female presents to therapy s/p R THA, anterior approach on 03/09/2024 due to failure of conservative measures. Pt PMH includes but is no limited to: anemia, anxiety, OA, fibromyalgia, GERD, HTN, back and cervical surgeries.  Clinical Impression    Katherine Reed is a 78 y.o. female POD 0 s/p R THA. Patient reports mod I with mobility and deconditioned  baseline. Patient is now limited by functional impairments (see PT problem list below) and requires min A for bed mobility and min/CGA for transfers and gait with RW. Patient was able to ambulate 40 feet with RW and CGA to min A and cues for safe walker management. Patient educated on safe sequencing for stair mobility with use of RW, fall risk prevention, slowly increasing activity tolerance, use of RW, pain management and goal, use of CP/ice and car transfers pt and daughter verbalized understanding safe guarding position for people assisting with mobility and daughter is aware of required physical assist at this time. Patient instructed in exercises to facilitate ROM and circulation reviewed and HO provided. Patient will benefit from continued skilled PT interventions to address impairments and progress towards PLOF. Patient has met mobility goals at adequate level for discharge home with family support and HEP; will continue to follow if pt continues acute stay to progress towards Mod I goals.       If plan is discharge home, recommend the following: A little help with walking and/or transfers;A little help with bathing/dressing/bathroom;Assistance with cooking/housework;Assist for transportation;Help with stairs or ramp for entrance   Can travel by private vehicle        Equipment Recommendations None recommended by PT  Recommendations for Other Services       Functional Status Assessment  Patient has had a recent decline in their functional status and demonstrates the ability to make significant improvements in function in a reasonable and predictable amount of time.     Precautions / Restrictions Precautions Precautions: Fall (poor activity tolerance) Restrictions Weight Bearing Restrictions Per Provider Order: No      Mobility  Bed Mobility Overal bed mobility: Needs Assistance Bed Mobility: Supine to Sit     Supine to sit: Min assist, HOB elevated, Used rails     General bed mobility comments: cues    Transfers Overall transfer level: Needs assistance Equipment used: Rolling walker (2 wheels) Transfers: Sit to/from Stand Sit to Stand: Contact guard assist, Min assist           General transfer comment: pt initally required min A for sit to stand and pt able to progress to CGA and cues with push to stand from arm rests on recliner    Ambulation/Gait Ambulation/Gait assistance: Contact guard assist, Min assist Gait Distance (Feet): 40 Feet Assistive device: Rolling walker (2 wheels) Gait Pattern/deviations: Step-to pattern, Decreased stance time - left, Antalgic, Wide base of support, Trunk flexed Gait velocity: decrased with pt requiring standing therapeutic rest breaks with amb bout     General Gait Details: trunk flexion with B UE support at RW to offload R LE in stance phase, pt amb with wide BOS, lateral sway and step to pattern, cues for maitnaining R LE inside RW with gait and transfer task, proper RW management and approach to sitting surfafces  Stairs Stairs: Yes Stairs assistance: Contact guard assist Stair Management: Two rails Number of Stairs: 1 General stair comments: cues  for navigating one step to enter home with use of RW with pt and daughter verbalizing understanding and pt able to perform step navigation x 1 4 inch step with B handrails and cues  Wheelchair Mobility     Tilt Bed    Modified Rankin (Stroke Patients Only)        Balance Overall balance assessment: Needs assistance Sitting-balance support: Feet supported Sitting balance-Leahy Scale: Good     Standing balance support: Bilateral upper extremity supported, During functional activity, Reliant on assistive device for balance Standing balance-Leahy Scale: Poor                               Pertinent Vitals/Pain Pain Assessment Pain Assessment: 0-10 Pain Score: 5  Pain Location: back and R LE and hip Pain Descriptors / Indicators: Aching, Constant, Discomfort, Grimacing, Operative site guarding Pain Intervention(s): Monitored during session, Limited activity within patient's tolerance, Premedicated before session, Repositioned, Ice applied    Home Living Family/patient expects to be discharged to:: Private residence Living Arrangements: Alone Available Help at Discharge: Family Type of Home: House Home Access: Stairs to enter Entrance Stairs-Rails: None Secretary/administrator of Steps: 1   Home Layout: One level Home Equipment: Agricultural Consultant (2 wheels) (walking stick)      Prior Function Prior Level of Function : Independent/Modified Independent;Driving             Mobility Comments: mod I with use of walking stick for all ADLs, self care tasks and IADLs       Extremity/Trunk Assessment        Lower Extremity Assessment Lower Extremity Assessment: RLE deficits/detail RLE Deficits / Details: ankle DF/PF 5/5 RLE Sensation: WNL    Cervical / Trunk Assessment Cervical / Trunk Assessment: Back Surgery;Neck Surgery  Communication   Communication Communication: No apparent difficulties    Cognition Arousal: Alert Behavior During Therapy: WFL for tasks assessed/performed   PT - Cognitive impairments: No apparent impairments                         Following commands: Intact       Cueing       General Comments      Exercises Total Joint Exercises Ankle Circles/Pumps: AROM, Both, 10  reps Quad Sets: AROM, Right, 5 reps Heel Slides: AROM, Right, 5 reps Long Arc Quad: AROM, Right, 5 reps, Seated Knee Flexion: AROM, Right, 5 reps, Standing Standing Hip Extension: AROM, Right, 5 reps, Standing   Assessment/Plan    PT Assessment Patient needs continued PT services  PT Problem List Decreased strength;Decreased range of motion;Decreased activity tolerance;Decreased balance;Decreased mobility;Pain       PT Treatment Interventions DME instruction;Gait training;Stair training;Functional mobility training;Therapeutic activities;Therapeutic exercise;Balance training;Neuromuscular re-education;Patient/family education;Modalities    PT Goals (Current goals can be found in the Care Plan section)  Acute Rehab PT Goals Patient Stated Goal: to be able to take care of myself and my house get back to yard work PT Goal Formulation: With patient Time For Goal Achievement: 03/23/24 Potential to Achieve Goals: Good    Frequency 7X/week     Co-evaluation               AM-PAC PT 6 Clicks Mobility  Outcome Measure Help needed turning from your back to your side while in a flat bed without using bedrails?: A Little Help needed moving from lying on your back to sitting on  the side of a flat bed without using bedrails?: A Little Help needed moving to and from a bed to a chair (including a wheelchair)?: A Little Help needed standing up from a chair using your arms (e.g., wheelchair or bedside chair)?: A Little Help needed to walk in hospital room?: A Little Help needed climbing 3-5 steps with a railing? : A Little 6 Click Score: 18    End of Session Equipment Utilized During Treatment: Gait belt Activity Tolerance: Patient limited by fatigue Patient left: in chair;with call bell/phone within reach;with family/visitor present Nurse Communication: Mobility status;Other (comment) (pt readiness for d/c from PT standpoint) PT Visit Diagnosis: Other abnormalities of gait and  mobility (R26.89);Unsteadiness on feet (R26.81);Muscle weakness (generalized) (M62.81);Difficulty in walking, not elsewhere classified (R26.2);Pain Pain - Right/Left: Right Pain - part of body: Leg;Hip    Time: 8499-8451 PT Time Calculation (min) (ACUTE ONLY): 48 min   Charges:   PT Evaluation $PT Eval Low Complexity: 1 Low PT Treatments $Gait Training: 8-22 mins $Therapeutic Exercise: 8-22 mins PT General Charges $$ ACUTE PT VISIT: 1 Visit         [place], PT Acute Rehab   [place] VEAR Drone 03/09/2024, 5:13 PM

## 2024-03-09 NOTE — Interval H&P Note (Signed)
 History and Physical Interval Note:  03/09/2024 7:45 AM  Katherine Reed  has presented today for surgery, with the diagnosis of Avascular necrosis right hip.  The various methods of treatment have been discussed with the patient and family. After consideration of risks, benefits and other options for treatment, the patient has consented to  Procedure(s): ARTHROPLASTY, HIP, TOTAL, ANTERIOR APPROACH (Right) as a surgical intervention.  The patient's history has been reviewed, patient examined, no change in status, stable for surgery.  I have reviewed the patient's chart and labs.  Questions were answered to the patient's satisfaction.     Redell PARAS Garreth Burnsworth

## 2024-03-09 NOTE — Anesthesia Procedure Notes (Signed)
 Spinal  Start time: 03/09/2024 8:25 AM End time: 03/09/2024 8:29 AM Reason for block: surgical anesthesia Staffing Performed: resident/CRNA  Resident/CRNA: Memory Armida LABOR, CRNA Performed by: Memory Armida LABOR, CRNA Authorized by: Merla Almarie HERO, DO   Preanesthetic Checklist Completed: patient identified, IV checked, site marked, risks and benefits discussed, surgical consent, monitors and equipment checked, pre-op evaluation and timeout performed Spinal Block Patient position: sitting Prep: DuraPrep and site prepped and draped Patient monitoring: heart rate, cardiac monitor, continuous pulse ox and blood pressure Approach: midline Location: L3-4 Injection technique: single-shot Needle Needle type: Pencan  Needle gauge: 24 G Needle length: 10 cm Assessment Events: CSF return Additional Notes Pt placed in sitting position for spinal placement. Spinal kit expiration date checked and verified. Lot # [id number]. Pt placed in sitting position. Sterile prep and drape of back. Site anesthetized with local. One attempt by CRNA. - heme. +free flowing clear CSF. Pt tolerated well. Placed supine after spinal placement

## 2024-03-09 NOTE — Discharge Instructions (Signed)

## 2024-03-09 NOTE — Anesthesia Procedure Notes (Signed)
 Procedure Name: MAC Date/Time: 03/09/2024 8:22 AM  Performed by: Memory Armida LABOR, CRNAPre-anesthesia Checklist: Patient identified, Emergency Drugs available, Suction available, Patient being monitored and Timeout performed Patient Re-evaluated:Patient Re-evaluated prior to induction Oxygen Delivery Method: Simple face mask Placement Confirmation: positive ETCO2 Dental Injury: Teeth and Oropharynx as per pre-operative assessment

## 2024-03-09 NOTE — Anesthesia Postprocedure Evaluation (Signed)
 Anesthesia Post Note  Patient: [name]  Procedure(s) Performed: ARTHROPLASTY, HIP, TOTAL, ANTERIOR APPROACH (Right: Hip)     Patient location during evaluation: PACU Anesthesia Type: Spinal and MAC Level of consciousness: awake and alert and oriented Pain management: pain level controlled Vital Signs Assessment: post-procedure vital signs reviewed and stable Respiratory status: spontaneous breathing, nonlabored ventilation and respiratory function stable Cardiovascular status: blood pressure returned to baseline and stable Postop Assessment: no headache, no backache, spinal receding and no apparent nausea or vomiting Anesthetic complications: no   No notable events documented.  Last Vitals:  Vitals:   03/09/24 1245 03/09/24 1304  BP: 130/68 138/66  Pulse: 80 88  Resp: 10   Temp: 36.5 C   SpO2: 98% 97%    Last Pain:  Vitals:   03/09/24 1400  TempSrc:   PainSc: 4                  Katherine Reed

## 2024-03-10 ENCOUNTER — Encounter (HOSPITAL_COMMUNITY): Payer: Self-pay | Admitting: Orthopedic Surgery
